# Patient Record
Sex: Male | Born: 1955 | Race: White | Hispanic: No | Marital: Married
Health system: Southern US, Community
[De-identification: ages and names within clinical notes are randomized; demographics above are authoritative.]

## PROBLEM LIST (undated history)

## (undated) DIAGNOSIS — I10 Essential (primary) hypertension: Secondary | ICD-10-CM

## (undated) DIAGNOSIS — K219 Gastro-esophageal reflux disease without esophagitis: Secondary | ICD-10-CM

## (undated) DIAGNOSIS — E785 Hyperlipidemia, unspecified: Secondary | ICD-10-CM

## (undated) DIAGNOSIS — K635 Polyp of colon: Secondary | ICD-10-CM

## (undated) HISTORY — DX: Essential (primary) hypertension: I10

## (undated) HISTORY — DX: Polyp of colon: K63.5

## (undated) HISTORY — DX: Gastro-esophageal reflux disease without esophagitis: K21.9

## (undated) HISTORY — PX: CATARACT EXTRACTION: SUR2

## (undated) HISTORY — DX: Hyperlipidemia, unspecified: E78.5

---

## 2007-06-19 LAB — HM COLONOSCOPY

## 2008-03-28 DIAGNOSIS — K635 Polyp of colon: Secondary | ICD-10-CM

## 2008-03-28 HISTORY — DX: Polyp of colon: K63.5

## 2013-01-31 ENCOUNTER — Encounter: Payer: Self-pay | Admitting: Gastroenterology

## 2013-03-13 ENCOUNTER — Ambulatory Visit: Payer: Self-pay | Admitting: Gastroenterology

## 2013-04-15 ENCOUNTER — Encounter: Payer: Self-pay | Admitting: Gastroenterology

## 2013-04-15 ENCOUNTER — Ambulatory Visit (INDEPENDENT_AMBULATORY_CARE_PROVIDER_SITE_OTHER): Payer: BC Managed Care – PPO | Admitting: Gastroenterology

## 2013-04-15 VITALS — BP 110/76 | HR 64 | Ht 64.5 in | Wt 226.1 lb

## 2013-04-15 DIAGNOSIS — Z1211 Encounter for screening for malignant neoplasm of colon: Secondary | ICD-10-CM

## 2013-04-15 DIAGNOSIS — K219 Gastro-esophageal reflux disease without esophagitis: Secondary | ICD-10-CM

## 2013-04-15 NOTE — Patient Instructions (Signed)
Follow up as needed

## 2013-04-15 NOTE — Assessment & Plan Note (Signed)
Prior colonoscopy apparently was normal 7 years ago.  Plan followup colonoscopy in 3 years

## 2013-04-15 NOTE — Assessment & Plan Note (Signed)
Symptoms are well controlled with Protonix.  Endoscopy 7 years ago by report was unremarkable.  Recommendations #1 continue Protonix.  He may try reducing to every other day.  In the absence of symptoms including ongoing pain or dysphagia I do not think repeat endoscopy is required.

## 2013-04-15 NOTE — Progress Notes (Signed)
    _                                                                                                                History of Present Illness: Pleasant 58 year old white male referred at the request of Dr. Kennon Holter for evaluation of reflux.  He has had reflux for at least 10 years.  Symptoms are well controlled with Protonix.  He apparently underwent colonoscopy and upper endoscopy about 7 years ago which were unremarkable.  If he misses doses of medication he may get pyrosis.  He denies dysphagia.  There is no history of rectal bleeding or change in bowel habits.    Past Medical History  Diagnosis Date  . Colon polyps 2010  . GERD (gastroesophageal reflux disease)   . HLD (hyperlipidemia)    History reviewed. No pertinent past surgical history. family history includes Brain cancer in his mother; Diabetes in his sister; Heart disease in his father. Current Outpatient Prescriptions  Medication Sig Dispense Refill  . pantoprazole (PROTONIX) 20 MG tablet Take 20 mg by mouth 2 (two) times daily.       No current facility-administered medications for this visit.   Allergies as of 04/15/2013 - Review Complete 04/15/2013  Allergen Reaction Noted  . Scallops [shellfish allergy]  04/15/2013    reports that he has never smoked. He has never used smokeless tobacco. He reports that he drinks alcohol. He reports that he does not use illicit drugs.     Review of Systems: Pertinent positive and negative review of systems were noted in the above HPI section. All other review of systems were otherwise negative.  Vital signs were reviewed in today's medical record Physical Exam: General: Well developed , well nourished, no acute distress Skin: anicteric Head: Normocephalic and atraumatic Eyes:  sclerae anicteric, EOMI Ears: Normal auditory acuity Mouth: No deformity or lesions Neck: Supple, no masses or thyromegaly Lungs: Clear throughout to auscultation Heart: Regular rate and  rhythm; no murmurs, rubs or bruits Abdomen: Soft, non tender and non distended. No masses, hepatosplenomegaly or hernias noted. Normal Bowel sounds Rectal:deferred Musculoskeletal: Symmetrical with no gross deformities  Skin: No lesions on visible extremities Pulses:  Normal pulses noted Extremities: No clubbing, cyanosis, edema or deformities noted Neurological: Alert oriented x 4, grossly nonfocal Cervical Nodes:  No significant cervical adenopathy Inguinal Nodes: No significant inguinal adenopathy Psychological:  Alert and cooperative. Normal mood and affect  See Assessment and Plan under Problem List

## 2014-04-04 ENCOUNTER — Telehealth: Payer: Self-pay

## 2014-04-04 ENCOUNTER — Ambulatory Visit (INDEPENDENT_AMBULATORY_CARE_PROVIDER_SITE_OTHER): Payer: BLUE CROSS/BLUE SHIELD | Admitting: Family Medicine

## 2014-04-04 VITALS — BP 124/88 | HR 62 | Temp 98.1°F | Resp 16 | Ht 66.0 in | Wt 226.0 lb

## 2014-04-04 DIAGNOSIS — M5432 Sciatica, left side: Secondary | ICD-10-CM

## 2014-04-04 MED ORDER — PREDNISONE 20 MG PO TABS: 20.0000 mg | ORAL_TABLET | Freq: Every morning | ORAL | Status: DC

## 2014-04-04 MED ORDER — HYDROCODONE-ACETAMINOPHEN 5-325 MG PO TABS: 1.0000 | ORAL_TABLET | Freq: Every evening | ORAL | Status: DC | PRN

## 2014-04-04 NOTE — Telephone Encounter (Signed)
Patient was seen today and given 2 medications. The "Predinsone" was called in to [facility] on [address] and per patient they will not fill it. Patient stated the pharmacist informed him the directions were written incorrectly. Patient is requesting our office to please contact [facility] to clarify directions and to call him to confirm it has been done at [phone]

## 2014-04-04 NOTE — Patient Instructions (Signed)
Sciatica Sciatica is pain, weakness, numbness, or tingling along the path of the sciatic nerve. The nerve starts in the lower back and runs down the back of each leg. The nerve controls the muscles in the lower leg and in the back of the knee, while also providing sensation to the back of the thigh, lower leg, and the sole of your foot. Sciatica is a symptom of another medical condition. For instance, nerve damage or certain conditions, such as a herniated disk or bone spur on the spine, pinch or put pressure on the sciatic nerve. This causes the pain, weakness, or other sensations normally associated with sciatica. Generally, sciatica only affects one side of the body. CAUSES   Herniated or slipped disc.  Degenerative disk disease.  A pain disorder involving the narrow muscle in the buttocks (piriformis syndrome).  Pelvic injury or fracture.  Pregnancy.  Tumor (rare). SYMPTOMS  Symptoms can vary from mild to very severe. The symptoms usually travel from the low back to the buttocks and down the back of the leg. Symptoms can include:  Mild tingling or dull aches in the lower back, leg, or hip.  Numbness in the back of the calf or sole of the foot.  Burning sensations in the lower back, leg, or hip.  Sharp pains in the lower back, leg, or hip.  Leg weakness.  Severe back pain inhibiting movement. These symptoms may get worse with coughing, sneezing, laughing, or prolonged sitting or standing. Also, being overweight may worsen symptoms. DIAGNOSIS  Your caregiver will perform a physical exam to look for common symptoms of sciatica. He or she may ask you to do certain movements or activities that would trigger sciatic nerve pain. Other tests may be performed to find the cause of the sciatica. These may include:  Blood tests.  X-rays.  Imaging tests, such as an MRI or CT scan. TREATMENT  Treatment is directed at the cause of the sciatic pain. Sometimes, treatment is not necessary  and the pain and discomfort goes away on its own. If treatment is needed, your caregiver may suggest:  Over-the-counter medicines to relieve pain.  Prescription medicines, such as anti-inflammatory medicine, muscle relaxants, or narcotics.  Applying heat or ice to the painful area.  Steroid injections to lessen pain, irritation, and inflammation around the nerve.  Reducing activity during periods of pain.  Exercising and stretching to strengthen your abdomen and improve flexibility of your spine. Your caregiver may suggest losing weight if the extra weight makes the back pain worse.  Physical therapy.  Surgery to eliminate what is pressing or pinching the nerve, such as a bone spur or part of a herniated disk. HOME CARE INSTRUCTIONS   Only take over-the-counter or prescription medicines for pain or discomfort as directed by your caregiver.  Apply ice to the affected area for 20 minutes, 3-4 times a day for the first 48-72 hours. Then try heat in the same way.  Exercise, stretch, or perform your usual activities if these do not aggravate your pain.  Attend physical therapy sessions as directed by your caregiver.  Keep all follow-up appointments as directed by your caregiver.  Do not wear high heels or shoes that do not provide proper support.  Check your mattress to see if it is too soft. A firm mattress may lessen your pain and discomfort. SEEK IMMEDIATE MEDICAL CARE IF:   You lose control of your bowel or bladder (incontinence).  You have increasing weakness in the lower back, pelvis, buttocks,   or legs.  You have redness or swelling of your back.  You have a burning sensation when you urinate.  You have pain that gets worse when you lie down or awakens you at night.  Your pain is worse than you have experienced in the past.  Your pain is lasting longer than 4 weeks.  You are suddenly losing weight without reason. MAKE SURE YOU:  Understand these  instructions.  Will watch your condition.  Will get help right away if you are not doing well or get worse. Document Released: 03/08/2001 Document Revised: 09/13/2011 Document Reviewed: 07/24/2011 ExitCare Patient Information 2015 [employer]. This information is not intended to replace advice given to you by your health care provider. Make sure you discuss any questions you have with your health care provider.  

## 2014-04-04 NOTE — Progress Notes (Signed)
   Subjective:  This chart was scribed for Robyn Haber, MD by Molli Posey, Medical scribe. This patient was seen in ROOM 4 and the patient's care was started 12:09 PM.   Patient ID: Drew [name], male    DOB: 1956/02/28, 59 y.o.   MRN: [id number]   Chief Complaint  Patient presents with  . Leg Pain    Left side, no relief with elevation, 8-9 on pain scale sometimes, started in foot about 6 months ago   HPI HPI Comments: Drew [name] is a 59 y.o. male with a history of GERD who presents to [facility] complaining of left leg pain that started about 6 months ago and has worsened in the last 2 weeks. He states that the pain started in the top part of his left foot 6 months ago but has since radiated up his left leg. Pt states that when he sits for extended periods of time his pain worsens and has been causing him problems with sleep. He states that sometimes when he rolls over when he is lying down he experiences "extreme pain" and says the muscle sometimes cramps. He states he has been taking ibuprofen, caffeine, acetaminophen medications for his pain. 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 He reports no pertinent past medical history. Pt reports NKDA.    Patient Active Problem List   Diagnosis Date Noted  . Esophageal reflux 04/15/2013  . Special screening for malignant neoplasms, colon 04/15/2013   Allergies  Allergen Reactions  . Scallops [Shellfish Allergy]    Current Outpatient Prescriptions on File Prior to Visit  Medication Sig Dispense Refill  . pantoprazole (PROTONIX) 20 MG tablet Take 20 mg by mouth 2 (two) times daily.     No current facility-administered medications on file prior to visit.    Family History  Problem Relation Age of Onset  . Diabetes Sister   . Heart disease Father   . Brain cancer Mother     chemical exposure   History reviewed. No pertinent past surgical history.   Review of Systems    Musculoskeletal: Positive for myalgias.      Objective:   Physical Exam  Constitutional: He is oriented to person, place, and time. He appears well-developed and well-nourished.  HENT:  Head: Normocephalic and atraumatic.  Eyes: Right eye exhibits no discharge. Left eye exhibits no discharge.  Neck: Neck supple. No tracheal deviation present.  Cardiovascular: Normal rate.   Pulmonary/Chest: Effort normal. No respiratory distress.  Abdominal: He exhibits no distension.  Neurological: He is alert and oriented to person, place, and time.  Skin: Skin is warm and dry.  Psychiatric: He has a normal mood and affect. His behavior is normal.  Nursing note and vitals reviewed.  Positive SLR on left, positive fem stretch on left Normal reflexes on lower extrem No edema Good distal pulses    Assessment & Plan:   This chart was scribed in my presence and reviewed by me personally.    ICD-9-CM ICD-10-CM   1. Sciatica, left 724.3 M54.32 predniSONE (DELTASONE) 20 MG tablet     HYDROcodone-acetaminophen (NORCO) 5-325 MG per tablet     Ambulatory referral to Physical Therapy     Signed, Robyn Haber, MD

## 2014-04-04 NOTE — Telephone Encounter (Signed)
Confirmed this with the pharmacy- 2 daily for 5 days/1 daily for 5 days. Pt notified.

## 2014-06-13 ENCOUNTER — Other Ambulatory Visit: Payer: Self-pay | Admitting: Family Medicine

## 2014-06-13 ENCOUNTER — Ambulatory Visit (INDEPENDENT_AMBULATORY_CARE_PROVIDER_SITE_OTHER): Payer: BC Managed Care – PPO | Admitting: Family Medicine

## 2014-06-13 ENCOUNTER — Ambulatory Visit
Admission: RE | Admit: 2014-06-13 | Discharge: 2014-06-13 | Disposition: A | Discharge status: Home / Self Care | Payer: BC Managed Care – PPO | Source: Ambulatory Visit | Attending: Family Medicine | Admitting: Family Medicine

## 2014-06-13 ENCOUNTER — Other Ambulatory Visit: Payer: BC Managed Care – PPO

## 2014-06-13 ENCOUNTER — Ambulatory Visit: Payer: BC Managed Care – PPO

## 2014-06-13 VITALS — BP 122/78 | HR 61 | Temp 98.2°F | Resp 16 | Ht 64.5 in | Wt 223.4 lb

## 2014-06-13 DIAGNOSIS — M5431 Sciatica, right side: Secondary | ICD-10-CM | POA: Diagnosis not present

## 2014-06-13 DIAGNOSIS — Z1389 Encounter for screening for other disorder: Secondary | ICD-10-CM | POA: Diagnosis not present

## 2014-06-13 DIAGNOSIS — M545 Low back pain, unspecified: Secondary | ICD-10-CM

## 2014-06-13 MED ORDER — MELOXICAM 7.5 MG PO TABS: 7.5000 mg | ORAL_TABLET | Freq: Every day | ORAL | Status: DC

## 2014-06-13 MED ORDER — OXYCODONE-ACETAMINOPHEN 7.5-325 MG PO TABS: 1.0000 | ORAL_TABLET | ORAL | Status: DC | PRN

## 2014-06-13 MED ORDER — CYCLOBENZAPRINE HCL 5 MG PO TABS: 5.0000 mg | ORAL_TABLET | Freq: Three times a day (TID) | ORAL | Status: DC | PRN

## 2014-06-13 NOTE — Progress Notes (Addendum)
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 He's been having back pain since before January. At that time, he also had a problem with his right foot.  Patient was sent for physical therapy in January and has done fairly well with this right foot problem but his back pain is persisted. One week ago he fell down while holding a ladder as he backed up. He landed on his "tailbone" and has had severe pain ever since. He's been unable to sleep more than 1-2 hours every night and has resorted to taking some hydrocodone that was prescribed back in January.  He's had no problems with elimination, no weakness in his legs. He does have trouble standing erect, preferring to stand with slightly stooped posture to protect his back.  Objective: Patient has tenderness over the right posterior superior iliac crest. He is able to stand on one leg at a time. No muscle wasting [facility] reading (PRIMARY) by  Dr. Joseph Art:  No metallic f.b. On skull films   Assessment: Patient clearly has acute back which needs further evaluation as well as pain control. He did not get much benefit from the prednisone in January, so will hold on that.  This chart was scribed in my presence and reviewed by me personally.    ICD-9-CM ICD-10-CM   1. Sciatica, right 724.3 M54.31 MR Lumbar Spine Wo Contrast     oxyCODONE-acetaminophen (PERCOCET) 7.5-325 MG per tablet     cyclobenzaprine (FLEXERIL) 5 MG tablet     meloxicam (MOBIC) 7.5 MG tablet  2. Acute low back pain 724.2 M54.5 oxyCODONE-acetaminophen (PERCOCET) 7.5-325 MG per tablet     cyclobenzaprine (FLEXERIL) 5 MG tablet     meloxicam (MOBIC) 7.5 MG tablet  3. Encounter for imaging to screen for metal prior to MRI V82.89 Z13.89 DG Eye Foreign Body     Signed, Robyn Haber, MD

## 2014-06-13 NOTE — Patient Instructions (Signed)
Go to [facility] for MRI Lumbar spine (without contrast) 06/13/14 at 8:10pm [place] # [id number]

## 2014-06-15 ENCOUNTER — Other Ambulatory Visit: Payer: Self-pay | Admitting: Family Medicine

## 2014-06-15 DIAGNOSIS — M519 Unspecified thoracic, thoracolumbar and lumbosacral intervertebral disc disorder: Secondary | ICD-10-CM

## 2014-06-19 ENCOUNTER — Other Ambulatory Visit: Payer: BC Managed Care – PPO

## 2014-07-22 ENCOUNTER — Ambulatory Visit (INDEPENDENT_AMBULATORY_CARE_PROVIDER_SITE_OTHER): Payer: BC Managed Care – PPO | Admitting: Emergency Medicine

## 2014-07-22 VITALS — BP 120/76 | HR 60 | Temp 98.0°F | Resp 16 | Ht 64.5 in | Wt 218.2 lb

## 2014-07-22 DIAGNOSIS — M5432 Sciatica, left side: Secondary | ICD-10-CM

## 2014-07-22 DIAGNOSIS — M5430 Sciatica, unspecified side: Secondary | ICD-10-CM | POA: Insufficient documentation

## 2014-07-22 MED ORDER — NAPROXEN SODIUM 550 MG PO TABS: 550.0000 mg | ORAL_TABLET | Freq: Two times a day (BID) | ORAL | Status: DC

## 2014-07-22 NOTE — Patient Instructions (Signed)
Sciatica Sciatica is pain, weakness, numbness, or tingling along the path of the sciatic nerve. The nerve starts in the lower back and runs down the back of each leg. The nerve controls the muscles in the lower leg and in the back of the knee, while also providing sensation to the back of the thigh, lower leg, and the sole of your foot. Sciatica is a symptom of another medical condition. For instance, nerve damage or certain conditions, such as a herniated disk or bone spur on the spine, pinch or put pressure on the sciatic nerve. This causes the pain, weakness, or other sensations normally associated with sciatica. Generally, sciatica only affects one side of the body. CAUSES   Herniated or slipped disc.  Degenerative disk disease.  A pain disorder involving the narrow muscle in the buttocks (piriformis syndrome).  Pelvic injury or fracture.  Pregnancy.  Tumor (rare). SYMPTOMS  Symptoms can vary from mild to very severe. The symptoms usually travel from the low back to the buttocks and down the back of the leg. Symptoms can include:  Mild tingling or dull aches in the lower back, leg, or hip.  Numbness in the back of the calf or sole of the foot.  Burning sensations in the lower back, leg, or hip.  Sharp pains in the lower back, leg, or hip.  Leg weakness.  Severe back pain inhibiting movement. These symptoms may get worse with coughing, sneezing, laughing, or prolonged sitting or standing. Also, being overweight may worsen symptoms. DIAGNOSIS  Your caregiver will perform a physical exam to look for common symptoms of sciatica. He or she may ask you to do certain movements or activities that would trigger sciatic nerve pain. Other tests may be performed to find the cause of the sciatica. These may include:  Blood tests.  X-rays.  Imaging tests, such as an MRI or CT scan. TREATMENT  Treatment is directed at the cause of the sciatic pain. Sometimes, treatment is not necessary  and the pain and discomfort goes away on its own. If treatment is needed, your caregiver may suggest:  Over-the-counter medicines to relieve pain.  Prescription medicines, such as anti-inflammatory medicine, muscle relaxants, or narcotics.  Applying heat or ice to the painful area.  Steroid injections to lessen pain, irritation, and inflammation around the nerve.  Reducing activity during periods of pain.  Exercising and stretching to strengthen your abdomen and improve flexibility of your spine. Your caregiver may suggest losing weight if the extra weight makes the back pain worse.  Physical therapy.  Surgery to eliminate what is pressing or pinching the nerve, such as a bone spur or part of a herniated disk. HOME CARE INSTRUCTIONS   Only take over-the-counter or prescription medicines for pain or discomfort as directed by your caregiver.  Apply ice to the affected area for 20 minutes, 3-4 times a day for the first 48-72 hours. Then try heat in the same way.  Exercise, stretch, or perform your usual activities if these do not aggravate your pain.  Attend physical therapy sessions as directed by your caregiver.  Keep all follow-up appointments as directed by your caregiver.  Do not wear high heels or shoes that do not provide proper support.  Check your mattress to see if it is too soft. A firm mattress may lessen your pain and discomfort. SEEK IMMEDIATE MEDICAL CARE IF:   You lose control of your bowel or bladder (incontinence).  You have increasing weakness in the lower back, pelvis, buttocks,   or legs.  You have redness or swelling of your back.  You have a burning sensation when you urinate.  You have pain that gets worse when you lie down or awakens you at night.  Your pain is worse than you have experienced in the past.  Your pain is lasting longer than 4 weeks.  You are suddenly losing weight without reason. MAKE SURE YOU:  Understand these  instructions.  Will watch your condition.  Will get help right away if you are not doing well or get worse. Document Released: 03/08/2001 Document Revised: 09/13/2011 Document Reviewed: 07/24/2011 ExitCare Patient Information 2015 [employer]. This information is not intended to replace advice given to you by your health care provider. Make sure you discuss any questions you have with your health care provider.  

## 2014-07-22 NOTE — Progress Notes (Signed)
[facility] [address] [place] [address] [phone]  Date:  07/22/2014   Name:  Kaidence Callaway   DOB:  03-21-1956   MRN:  [id number]  PCP:  Elizabeth Palau, MD    Chief Complaint: Medication Refill and Follow-up   History of Present Illness:  Benjamen Koelling is a 59 y.o. very pleasant male patient who presents with the following:  History of sciatic neuritis with left leg.  Has positive MRI  Has been relying on percocet and oxycontin and wants to avoid medication if possible Works as professor at [employer] Has not had surgical opinion No weakness or tingling. No improvement with over the counter medications or other home remedies. . Denies other complaint or health concern today.   Patient Active Problem List   Diagnosis Date Noted  . 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 Esophageal reflux 04/15/2013  . Special screening for malignant neoplasms, colon 04/15/2013    Past Medical History  Diagnosis Date  . Colon polyps 2010  . GERD (gastroesophageal reflux disease)   . HLD (hyperlipidemia)     History reviewed. No pertinent past surgical history.  History  Substance Use Topics  . Smoking status: Never Smoker   . Smokeless tobacco: Never Used  . Alcohol Use: 0.0 oz/week    0 Standard drinks or equivalent per week     Comment: 1-2 per day    Family History  Problem Relation Age of Onset  . Diabetes Sister   . Heart disease Father   . Brain cancer Mother     chemical exposure    Allergies  Allergen Reactions  . Scallops [Shellfish Allergy]     Medication list has been reviewed and updated.  Current Outpatient Prescriptions on File Prior to Visit  Medication Sig Dispense Refill  . cyclobenzaprine (FLEXERIL) 5 MG tablet Take 1 tablet (5 mg total) by mouth 3 (three) times daily as needed for muscle spasms. 30 tablet 0  . HYDROcodone-acetaminophen (NORCO) 5-325 MG per tablet Take 1 tablet by mouth at bedtime as needed for moderate pain. 12  tablet 0  . oxyCODONE-acetaminophen (PERCOCET) 7.5-325 MG per tablet Take 1 tablet by mouth every 4 (four) hours as needed for pain. 30 tablet 0  . pantoprazole (PROTONIX) 20 MG tablet Take 20 mg by mouth 2 (two) times daily.    . meloxicam (MOBIC) 7.5 MG tablet Take 1 tablet (7.5 mg total) by mouth daily. (Patient not taking: Reported on 07/22/2014) 30 tablet 0  . predniSONE (DELTASONE) 20 MG tablet Take 1 tablet (20 mg total) by mouth every morning. 2 daily for 5 days, then one daily with food (Patient not taking: Reported on 06/13/2014) 15 tablet 0   No current facility-administered medications on file prior to visit.    Review of Systems:   GEN: WDWN, NAD, Non-toxic, Alert & Oriented x 3 HEENT: Atraumatic, Normocephalic.  Ears and Nose: No external deformity. EXTR: No clubbing/cyanosis/edema NEURO: Normal gait.  PSYCH: Normally interactive. Conversant. Not depressed or anxious appearing.  Calm demeanor.    Physical Examination: Filed Vitals:   07/22/14 1249  BP: 120/76  Pulse: 60  Temp: 98 F (36.7 C)  Resp: 16   Filed Vitals:   07/22/14 1249  Height: 5' 4.5" (1.638 m)  Weight: 218 lb 4 oz (98.998 kg)   Body mass index is 36.9 kg/(m^2). Ideal Body Weight: Weight in (lb) to have BMI = 25: 147.6   GEN: WDWN, NAD, Non-toxic, Alert & Oriented x 3  HEENT: Atraumatic, Normocephalic.  Ears and Nose: No external deformity. EXTR: No clubbing/cyanosis/edema NEURO: Normal gait.   Decreased patellar reflex on left.  Motor intact PSYCH: Normally interactive. Conversant. Not depressed or anxious appearing.  Calm demeanor.    Assessment and Plan: Sciatic neuritis [facility] Neurosurgery consultation  Signed,  Ellison Carwin, MD

## 2014-08-06 ENCOUNTER — Telehealth: Payer: Self-pay

## 2014-08-06 NOTE — Telephone Encounter (Signed)
Called [facility] to verify that they haven't tried to call him. He has not been scheduled yet. They said they will work on it, but it definitely won't be this week. Can we give him more pain meds until he is seen by them?

## 2014-08-06 NOTE — Telephone Encounter (Signed)
Pt says he has not heard from [facility] yet, he says he has not heard anything. He wants a refill of percocet to last until the appt. Referrals will call and check status

## 2014-08-07 NOTE — Telephone Encounter (Signed)
i'll be there at 2 to sign the prescription

## 2015-04-20 ENCOUNTER — Ambulatory Visit (INDEPENDENT_AMBULATORY_CARE_PROVIDER_SITE_OTHER): Payer: BC Managed Care – PPO | Admitting: Physician Assistant

## 2015-04-20 VITALS — BP 124/80 | HR 93 | Temp 98.4°F | Resp 17 | Ht 64.5 in | Wt 229.0 lb

## 2015-04-20 DIAGNOSIS — Z760 Encounter for issue of repeat prescription: Secondary | ICD-10-CM | POA: Diagnosis not present

## 2015-04-20 DIAGNOSIS — Z139 Encounter for screening, unspecified: Secondary | ICD-10-CM

## 2015-04-20 DIAGNOSIS — Z23 Encounter for immunization: Secondary | ICD-10-CM

## 2015-04-20 DIAGNOSIS — M47819 Spondylosis without myelopathy or radiculopathy, site unspecified: Secondary | ICD-10-CM | POA: Diagnosis not present

## 2015-04-20 LAB — POCT CBG (FASTING - GLUCOSE)-MANUAL ENTRY: Glucose Fasting, POC: 77 mg/dL (ref 70–99)

## 2015-04-20 LAB — POCT CBC
Granulocyte percent: 62.3 %G (ref 37–80)
HEMATOCRIT: 44.5 % (ref 43.5–53.7)
Hemoglobin: 15.4 g/dL (ref 14.1–18.1)
LYMPH, POC: 2.6 (ref 0.6–3.4)
MCH, POC: 30.6 pg (ref 27–31.2)
MCHC: 34.6 g/dL (ref 31.8–35.4)
MCV: 88.4 fL (ref 80–97)
MID (CBC): 0.4 (ref 0–0.9)
MPV: 7 fL (ref 0–99.8)
POC GRANULOCYTE: 4.9 (ref 2–6.9)
POC LYMPH PERCENT: 33.2 %L (ref 10–50)
POC MID %: 4.5 % (ref 0–12)
Platelet Count, POC: 199 10*3/uL (ref 142–424)
RBC: 5.03 M/uL (ref 4.69–6.13)
RDW, POC: 12.9 %
WBC: 7.9 10*3/uL (ref 4.6–10.2)

## 2015-04-20 LAB — POCT GLYCOSYLATED HEMOGLOBIN (HGB A1C): Hemoglobin A1C: 5.8

## 2015-04-20 MED ORDER — NAPROXEN 500 MG PO TABS: 500.0000 mg | ORAL_TABLET | Freq: Two times a day (BID) | ORAL | Status: DC

## 2015-04-20 MED ORDER — PANTOPRAZOLE SODIUM 20 MG PO TBEC: 20.0000 mg | DELAYED_RELEASE_TABLET | Freq: Two times a day (BID) | ORAL | Status: DC

## 2015-04-20 MED ORDER — PREDNISONE 20 MG PO TABS: ORAL_TABLET | ORAL | Status: AC

## 2015-04-20 MED ORDER — HYDROCODONE-ACETAMINOPHEN 5-325 MG PO TABS: 1.0000 | ORAL_TABLET | Freq: Four times a day (QID) | ORAL | Status: DC | PRN

## 2015-04-20 NOTE — Patient Instructions (Signed)

## 2015-04-20 NOTE — Progress Notes (Signed)
04/20/2015 5:38 PM   DOB: September 05, 1955 / MRN: [id number]  SUBJECTIVE:  Drew [name] is a 60 y.o. male presenting for back pain, to establish care, and for and pantoprazole 20 mg.  He has had a colonoscopy roughly 8 years ago in [place] and he was advised to return in 10 years.  He has also had an upper endoscopy at that time and this was normal.  He has been taking pantoprazole since for GERD with excellent relief.Marland Kitchen  He only has a problems if he eats tomato based problems. He is a never smoker. He  has no history of HTN.    Reports he is having a flare of his back pain right now.  He has an MRI in [facility] showing spondylosis.  Denies any weakness, radicular pain, history of diabetes, incontinence.      He is allergic to scallops.   He  has a past medical history of Colon polyps (2010); GERD (gastroesophageal reflux disease); and HLD (hyperlipidemia).    He  reports that he has never smoked. He has never used smokeless tobacco. He reports that he drinks alcohol. He reports that he does not use illicit drugs. He  has no sexual activity history on file. The patient  has no past surgical history on file.  His family history includes Brain cancer in his mother; Diabetes in his sister; Heart disease in his father.  Review of Systems  Constitutional: Negative for fever and chills.  Eyes: Negative for blurred vision.  Respiratory: Negative for cough and shortness of breath.   Cardiovascular: Negative for chest pain.  Gastrointestinal: Negative for nausea and abdominal pain.  Genitourinary: Negative for dysuria, urgency and frequency.  Musculoskeletal: Negative for myalgias.  Skin: Negative for rash.  Neurological: Negative for dizziness, tingling and headaches.  Psychiatric/Behavioral: Negative for depression. The patient is not nervous/anxious.     Problem list and medications reviewed and updated by myself where necessary, and exist elsewhere in the encounter.   OBJECTIVE:  BP 124/80 mmHg   Pulse 93  Temp(Src) 98.4 F (36.9 C) (Oral)  Resp 17  Ht 5' 4.5" (1.638 m)  Wt 229 lb (103.874 kg)  BMI 38.71 kg/m2  SpO2 98%  Physical Exam  Constitutional: He is oriented to person, place, and time. He appears well-developed. He does not appear ill.  Eyes: Conjunctivae and EOM are normal. Pupils are equal, round, and reactive to light.  Cardiovascular: Normal rate.   Pulmonary/Chest: Effort normal.  Abdominal: He exhibits no distension.  Musculoskeletal: Normal range of motion.  Neurological: He is alert and oriented to person, place, and time. He has normal strength. No cranial nerve deficit or sensory deficit. Coordination and gait normal.  Reflex Scores:      Patellar reflexes are 2+ on the right side and 2+ on the left side.      Achilles reflexes are 2+ on the right side and 2+ on the left side. Skin: Skin is warm and dry. He is not diaphoretic.  Psychiatric: He has a normal mood and affect.  Nursing note and vitals reviewed.   Results for orders placed or performed in visit on 04/20/15 (from the past 72 hour(s))  POCT CBG (Fasting - Glucose)     Status: None   Collection Time: 04/20/15  4:16 PM  Result Value Ref Range   Glucose Fasting, POC 77 70 - 99 mg/dL  POCT glycosylated hemoglobin (Hb A1C)     Status: None   Collection Time: 04/20/15  4:16 PM  Result Value Ref Range   Hemoglobin A1C 5.8   POCT CBC     Status: None   Collection Time: 04/20/15  4:16 PM  Result Value Ref Range   WBC 7.9 4.6 - 10.2 K/uL   Lymph, poc 2.6 0.6 - 3.4   POC LYMPH PERCENT 33.2 10 - 50 %L   MID (cbc) 0.4 0 - 0.9   POC MID % 4.5 0 - 12 %M   POC Granulocyte 4.9 2 - 6.9   Granulocyte percent 62.3 37 - 80 %G   RBC 5.03 4.69 - 6.13 M/uL   Hemoglobin 15.4 14.1 - 18.1 g/dL   HCT, POC 44.5 43.5 - 53.7 %   MCV 88.4 80 - 97 fL   MCH, POC 30.6 27 - 31.2 pg   MCHC 34.6 31.8 - 35.4 g/dL   RDW, POC 12.9 %   Platelet Count, POC 199 142 - 424 K/uL   MPV 7.0 0 - 99.8 fL    ASSESSMENT AND  PLAN  Drew [name] was seen today for back pain.  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  He will try to get his endoscopy records for scanning purposes.  History of back pain, S/P corticosteroid injections with poor relief.  He has had an MRI which should some anterolithstesis.  He will bring his ortho records for scanning.  Will try to get him back to that provider for further management.  Advised that he continue being physically active, start prednisone today, and after start Naprosyn as needed.  Norco for severe pain only.   -     naproxen (NAPROSYN) 500 MG tablet; Take 1 tablet (500 mg total) by mouth 2 (two) times daily with a meal. Do not take with prednisone. -     predniSONE (DELTASONE) 20 MG tablet; Take 3 in the morning for 3 days, then 2 in the morning for 3 days, and then 1 in the morning for 3 days. -     HYDROcodone-acetaminophen (NORCO) 5-325 MG tablet; Take 1 tablet by mouth every 6 (six) hours as needed (For severe pain only.). For severe pain only.  Screening -     COMPLETE METABOLIC PANEL WITH GFR -     POCT CBG (Fasting - Glucose) -     POCT glycosylated hemoglobin (Hb A1C) -     Lipid panel -     POCT CBC  Medication refill -     pantoprazole (PROTONIX) 20 MG tablet; Take 1 tablet (20 mg total) by mouth 2 (two) times daily.  Other orders -     Discontinue: HYDROcodone-acetaminophen (NORCO) 5-325 MG tablet; Take 1 tablet by mouth every 6 (six) hours as needed for severe pain. For severe pain only.    The patient was advised to call or return to clinic if he does not see an improvement in symptoms or to seek the care of the closest emergency department if he worsens with the above plan.   Philis Fendt, MHS, PA-C [facility] 04/20/2015 5:38 PM

## 2015-04-21 LAB — COMPLETE METABOLIC PANEL WITH GFR
ALK PHOS: 69 U/L (ref 40–115)
ALT: 26 U/L (ref 9–46)
AST: 22 U/L (ref 10–35)
Albumin: 4.3 g/dL (ref 3.6–5.1)
BILIRUBIN TOTAL: 0.6 mg/dL (ref 0.2–1.2)
BUN: 20 mg/dL (ref 7–25)
CALCIUM: 9.7 mg/dL (ref 8.6–10.3)
CO2: 25 mmol/L (ref 20–31)
Chloride: 101 mmol/L (ref 98–110)
Creat: 1.26 mg/dL — ABNORMAL HIGH (ref 0.70–1.25)
GFR, EST AFRICAN AMERICAN: 71 mL/min (ref >=60)
GFR, Est Non African American: 61 mL/min (ref >=60)
Glucose, Bld: 93 mg/dL (ref 65–99)
Potassium: 4.2 mmol/L (ref 3.5–5.3)
Sodium: 140 mmol/L (ref 135–146)
TOTAL PROTEIN: 7.1 g/dL (ref 6.1–8.1)

## 2015-04-21 LAB — LIPID PANEL
CHOLESTEROL: 264 mg/dL — AB (ref 125–200)
HDL: 35 mg/dL — ABNORMAL LOW (ref >=40)
LDL Cholesterol: 159 mg/dL — ABNORMAL HIGH (ref <130)
TRIGLYCERIDES: 352 mg/dL — AB (ref <150)
Total CHOL/HDL Ratio: 7.5 Ratio — ABNORMAL HIGH (ref <=5.0)
VLDL: 70 mg/dL — ABNORMAL HIGH (ref <30)

## 2015-05-03 ENCOUNTER — Other Ambulatory Visit: Payer: Self-pay | Admitting: Physician Assistant

## 2015-05-03 DIAGNOSIS — Z299 Encounter for prophylactic measures, unspecified: Secondary | ICD-10-CM

## 2015-05-03 MED ORDER — ROSUVASTATIN CALCIUM 10 MG PO TABS: 10.0000 mg | ORAL_TABLET | Freq: Every day | ORAL | Status: DC

## 2015-05-03 MED ORDER — ASPIRIN 81 MG PO CHEW: 81.0000 mg | CHEWABLE_TABLET | Freq: Every day | ORAL | Status: DC

## 2015-05-14 ENCOUNTER — Ambulatory Visit (INDEPENDENT_AMBULATORY_CARE_PROVIDER_SITE_OTHER): Payer: BC Managed Care – PPO | Admitting: Physician Assistant

## 2015-05-14 VITALS — BP 108/78 | HR 67 | Temp 98.2°F | Resp 18 | Ht 64.5 in | Wt 227.2 lb

## 2015-05-14 DIAGNOSIS — R062 Wheezing: Secondary | ICD-10-CM | POA: Diagnosis not present

## 2015-05-14 DIAGNOSIS — R05 Cough: Secondary | ICD-10-CM

## 2015-05-14 DIAGNOSIS — R059 Cough, unspecified: Secondary | ICD-10-CM

## 2015-05-14 LAB — POCT CBC
Granulocyte percent: 60.6 %G (ref 37–80)
HCT, POC: 44.2 % (ref 43.5–53.7)
HEMOGLOBIN: 14.9 g/dL (ref 14.1–18.1)
LYMPH, POC: 1.7 (ref 0.6–3.4)
MCH, POC: 30.3 pg (ref 27–31.2)
MCHC: 33.7 g/dL (ref 31.8–35.4)
MCV: 89.9 fL (ref 80–97)
MID (cbc): 0.2 (ref 0–0.9)
MPV: 7.3 fL (ref 0–99.8)
POC Granulocyte: 3 (ref 2–6.9)
POC LYMPH PERCENT: 35.2 %L (ref 10–50)
POC MID %: 4.2 % (ref 0–12)
Platelet Count, POC: 160 10*3/uL (ref 142–424)
RBC: 4.92 M/uL (ref 4.69–6.13)
RDW, POC: 13 %
WBC: 4.9 10*3/uL (ref 4.6–10.2)

## 2015-05-14 MED ORDER — IPRATROPIUM BROMIDE 0.02 % IN SOLN: 0.5000 mg | Freq: Once | RESPIRATORY_TRACT | Status: DC

## 2015-05-14 MED ORDER — ALBUTEROL SULFATE (2.5 MG/3ML) 0.083% IN NEBU: 2.5000 mg | INHALATION_SOLUTION | Freq: Once | RESPIRATORY_TRACT | Status: DC

## 2015-05-14 NOTE — Progress Notes (Signed)
05/14/2015 2:01 PM   DOB: 1956/01/30 / MRN: [id number]  SUBJECTIVE:  Drew [name] is a 60 y.o. male presenting for cough that started 5 days ago.  Also complains of nasal congestion, mild sore throat, and some myalgia.  He has been taking Naprosyn 500 mg with good relief of symptoms.    He is seen by Dr. Brien Few for back injections.   He is allergic to scallops.   He  has a past medical history of Colon polyps (2010); GERD (gastroesophageal reflux disease); and HLD (hyperlipidemia).    He  reports that he has never smoked. He has never used smokeless tobacco. He reports that he drinks alcohol. He reports that he does not use illicit drugs. He  has no sexual activity history on file. The patient  has no past surgical history on file.  His family history includes Brain cancer in his mother; Diabetes in his sister; Heart disease in his father.  Review of Systems  Respiratory: Positive for cough. Negative for shortness of breath.   Cardiovascular: Negative for chest pain.  Gastrointestinal: Negative for abdominal pain.  Musculoskeletal: Negative for myalgias.  Skin: Negative for rash.  Neurological: Negative for dizziness and headaches.    Problem list and medications reviewed and updated by myself where necessary, and exist elsewhere in the encounter.   OBJECTIVE:  BP 108/78 mmHg  Pulse 67  Temp(Src) 98.2 F (36.8 C) (Oral)  Resp 18  Ht 5' 4.5" (1.638 m)  Wt 227 lb 3.2 oz (103.057 kg)  BMI 38.41 kg/m2  SpO2 97%  Physical Exam  Constitutional: He is oriented to person, place, and time. He appears well-developed. He does not appear ill.  Eyes: Conjunctivae and EOM are normal. Pupils are equal, round, and reactive to light.  Cardiovascular: Normal rate.   Pulmonary/Chest: Effort normal.  Abdominal: He exhibits no distension.  Musculoskeletal: Normal range of motion.  Neurological: He is alert and oriented to person, place, and time. No cranial nerve deficit. Coordination  normal.  Skin: Skin is warm and dry. He is not diaphoretic.  Psychiatric: He has a normal mood and affect.  Nursing note and vitals reviewed.   Results for orders placed or performed in visit on 05/14/15 (from the past 72 hour(s))  POCT CBC     Status: Normal   Collection Time: 05/14/15  1:45 PM  Result Value Ref Range   WBC 4.9 4.6 - 10.2 K/uL   Lymph, poc 1.7 0.6 - 3.4   POC LYMPH PERCENT 35.2 10 - 50 %L   MID (cbc) 0.2 0 - 0.9   POC MID % 4.2 0 - 12 %M   POC Granulocyte 3.0 2 - 6.9   Granulocyte percent 60.6 37 - 80 %G   RBC 4.92 4.69 - 6.13 M/uL   Hemoglobin 14.9 14.1 - 18.1 g/dL   HCT, POC 44.2 43.5 - 53.7 %   MCV 89.9 80 - 97 fL   MCH, POC 30.3 27 - 31.2 pg   MCHC 33.7 31.8 - 35.4 g/dL   RDW, POC 13.0 %   Platelet Count, POC 160 142 - 424 K/uL   MPV 7.3 0 - 99.8 fL    No results found.  ASSESSMENT AND PLAN  Drew [name] was seen today for sore throat, cough and headache.  Diagnoses and all orders for this visit:  Cough: He is wheezing throughout. Pulse and sats normal.  Advised prednisone taper for viral induced bronchospasm.    -     POCT  CBC  Wheezing -     POCT CBC -     albuterol (PROVENTIL) (2.5 MG/3ML) 0.083% nebulizer solution 2.5 mg; Take 3 mLs (2.5 mg total) by nebulization once. -     ipratropium (ATROVENT) nebulizer solution 0.5 mg; Take 2.5 mLs (0.5 mg total) by nebulization once.    The patient was advised to call or return to clinic if he does not see an improvement in symptoms or to seek the care of the closest emergency department if he worsens with the above plan.   Philis Fendt, MHS, PA-C [facility] 05/14/2015 2:01 PM

## 2015-05-14 NOTE — Patient Instructions (Signed)
Take your prednisone as instructed on the bottle and stop your naproxen for now.

## 2015-06-09 ENCOUNTER — Encounter: Payer: Self-pay | Admitting: Family Medicine

## 2015-06-26 ENCOUNTER — Other Ambulatory Visit: Payer: Self-pay

## 2015-06-26 DIAGNOSIS — Z760 Encounter for issue of repeat prescription: Secondary | ICD-10-CM

## 2015-06-26 MED ORDER — PANTOPRAZOLE SODIUM 20 MG PO TBEC: 20.0000 mg | DELAYED_RELEASE_TABLET | Freq: Two times a day (BID) | ORAL | Status: DC

## 2015-08-07 ENCOUNTER — Other Ambulatory Visit: Payer: Self-pay

## 2015-08-07 DIAGNOSIS — Z299 Encounter for prophylactic measures, unspecified: Secondary | ICD-10-CM

## 2015-08-07 MED ORDER — ROSUVASTATIN CALCIUM 10 MG PO TABS: 10.0000 mg | ORAL_TABLET | Freq: Every day | ORAL | Status: DC

## 2015-08-31 ENCOUNTER — Emergency Department (HOSPITAL_COMMUNITY)
Admission: EM | Admit: 2015-08-31 | Discharge: 2015-08-31 | Disposition: A | Discharge status: Home / Self Care | Payer: BC Managed Care – PPO | Attending: Emergency Medicine | Admitting: Emergency Medicine

## 2015-08-31 ENCOUNTER — Emergency Department (HOSPITAL_COMMUNITY): Payer: BC Managed Care – PPO

## 2015-08-31 DIAGNOSIS — E785 Hyperlipidemia, unspecified: Secondary | ICD-10-CM | POA: Diagnosis not present

## 2015-08-31 DIAGNOSIS — Z7982 Long term (current) use of aspirin: Secondary | ICD-10-CM | POA: Diagnosis not present

## 2015-08-31 DIAGNOSIS — Z79899 Other long term (current) drug therapy: Secondary | ICD-10-CM | POA: Diagnosis not present

## 2015-08-31 DIAGNOSIS — R112 Nausea with vomiting, unspecified: Secondary | ICD-10-CM | POA: Insufficient documentation

## 2015-08-31 DIAGNOSIS — R42 Dizziness and giddiness: Secondary | ICD-10-CM | POA: Diagnosis not present

## 2015-08-31 DIAGNOSIS — Z791 Long term (current) use of non-steroidal anti-inflammatories (NSAID): Secondary | ICD-10-CM | POA: Insufficient documentation

## 2015-08-31 DIAGNOSIS — Z86010 Personal history of colonic polyps: Secondary | ICD-10-CM | POA: Insufficient documentation

## 2015-08-31 DIAGNOSIS — K219 Gastro-esophageal reflux disease without esophagitis: Secondary | ICD-10-CM | POA: Insufficient documentation

## 2015-08-31 LAB — URINALYSIS, ROUTINE W REFLEX MICROSCOPIC
Bilirubin Urine: NEGATIVE
Glucose, UA: NEGATIVE mg/dL
Hgb urine dipstick: NEGATIVE
Ketones, ur: 15 mg/dL — AB
LEUKOCYTES UA: NEGATIVE
NITRITE: NEGATIVE
PH: 7.5 (ref 5.0–8.0)
Protein, ur: NEGATIVE mg/dL
SPECIFIC GRAVITY, URINE: 1.021 (ref 1.005–1.030)

## 2015-08-31 LAB — COMPREHENSIVE METABOLIC PANEL
ALBUMIN: 4.1 g/dL (ref 3.5–5.0)
ALK PHOS: 71 U/L (ref 38–126)
ALT: 33 U/L (ref 17–63)
ANION GAP: 10 (ref 5–15)
AST: 28 U/L (ref 15–41)
BILIRUBIN TOTAL: 0.8 mg/dL (ref 0.3–1.2)
BUN: 15 mg/dL (ref 6–20)
CALCIUM: 9.1 mg/dL (ref 8.9–10.3)
CO2: 24 mmol/L (ref 22–32)
Chloride: 104 mmol/L (ref 101–111)
Creatinine, Ser: 1.07 mg/dL (ref 0.61–1.24)
GFR calc non Af Amer: >60 mL/min (ref >60)
Glucose, Bld: 155 mg/dL — ABNORMAL HIGH (ref 65–99)
POTASSIUM: 3.6 mmol/L (ref 3.5–5.1)
Sodium: 138 mmol/L (ref 135–145)
TOTAL PROTEIN: 7.3 g/dL (ref 6.5–8.1)

## 2015-08-31 LAB — CBC
HEMATOCRIT: 42.3 % (ref 39.0–52.0)
HEMOGLOBIN: 14 g/dL (ref 13.0–17.0)
MCH: 29.4 pg (ref 26.0–34.0)
MCHC: 33.1 g/dL (ref 30.0–36.0)
MCV: 88.9 fL (ref 78.0–100.0)
Platelets: 167 10*3/uL (ref 150–400)
RBC: 4.76 MIL/uL (ref 4.22–5.81)
RDW: 12.9 % (ref 11.5–15.5)
WBC: 8.3 10*3/uL (ref 4.0–10.5)

## 2015-08-31 LAB — LIPASE, BLOOD: Lipase: 22 U/L (ref 11–51)

## 2015-08-31 MED ORDER — ONDANSETRON HCL 4 MG/2ML IJ SOLN
4.0000 mg | Freq: Once | INTRAMUSCULAR | Status: AC
  Administered 2015-08-31: 4 mg via INTRAVENOUS
  Filled 2015-08-31: qty 2

## 2015-08-31 MED ORDER — MECLIZINE HCL 25 MG PO TABS
25.0000 mg | ORAL_TABLET | Freq: Once | ORAL | Status: AC
Start: 2015-08-31 — End: 2015-08-31
  Administered 2015-08-31: 25 mg via ORAL
  Filled 2015-08-31: qty 1

## 2015-08-31 MED ORDER — DIAZEPAM 5 MG PO TABS
5.0000 mg | ORAL_TABLET | Freq: Once | ORAL | Status: AC
  Administered 2015-08-31: 5 mg via ORAL
  Filled 2015-08-31: qty 1

## 2015-08-31 MED ORDER — MECLIZINE HCL 25 MG PO TABS: 25.0000 mg | ORAL_TABLET | Freq: Three times a day (TID) | ORAL | Status: DC | PRN

## 2015-08-31 MED ORDER — LORAZEPAM 2 MG/ML IJ SOLN
1.0000 mg | Freq: Once | INTRAMUSCULAR | Status: AC
  Administered 2015-08-31: 1 mg via INTRAVENOUS
  Filled 2015-08-31: qty 1

## 2015-08-31 NOTE — ED Notes (Signed)
Pt stable, ambulatory, states understanding of discharge instructions 

## 2015-08-31 NOTE — ED Provider Notes (Addendum)
Patient's MRI is negative. Small angioma seen, made patient/wife aware, f/u with PCP. He can get up but is still dizzy. Can take some steps in ED but feels unsteady. He wants to go home. Has help at home, seems reliable. D/c with antivert (nothing in particular helped while here). Discussed return precautions.  Sherwood Gambler, MD 08/31/15 1922   Results for orders placed or performed during the hospital encounter of 08/31/15  Lipase, blood  Result Value Ref Range   Lipase 22 11 - 51 U/L  Comprehensive metabolic panel  Result Value Ref Range   Sodium 138 135 - 145 mmol/L   Potassium 3.6 3.5 - 5.1 mmol/L   Chloride 104 101 - 111 mmol/L   CO2 24 22 - 32 mmol/L   Glucose, Bld 155 (H) 65 - 99 mg/dL   BUN 15 6 - 20 mg/dL   Creatinine, Ser 1.07 0.61 - 1.24 mg/dL   Calcium 9.1 8.9 - 10.3 mg/dL   Total Protein 7.3 6.5 - 8.1 g/dL   Albumin 4.1 3.5 - 5.0 g/dL   AST 28 15 - 41 U/L   ALT 33 17 - 63 U/L   Alkaline Phosphatase 71 38 - 126 U/L   Total Bilirubin 0.8 0.3 - 1.2 mg/dL   GFR calc non Af Amer >60 >60 mL/min   GFR calc Af Amer >60 >60 mL/min   Anion gap 10 5 - 15  CBC  Result Value Ref Range   WBC 8.3 4.0 - 10.5 K/uL   RBC 4.76 4.22 - 5.81 MIL/uL   Hemoglobin 14.0 13.0 - 17.0 g/dL   HCT 42.3 39.0 - 52.0 %   MCV 88.9 78.0 - 100.0 fL   MCH 29.4 26.0 - 34.0 pg   MCHC 33.1 30.0 - 36.0 g/dL   RDW 12.9 11.5 - 15.5 %   Platelets 167 150 - 400 K/uL  Urinalysis, Routine w reflex microscopic  Result Value Ref Range   Color, Urine YELLOW YELLOW   APPearance HAZY (A) CLEAR   Specific Gravity, Urine 1.021 1.005 - 1.030   pH 7.5 5.0 - 8.0   Glucose, UA NEGATIVE NEGATIVE mg/dL   Hgb urine dipstick NEGATIVE NEGATIVE   Bilirubin Urine NEGATIVE NEGATIVE   Ketones, ur 15 (A) NEGATIVE mg/dL   Protein, ur NEGATIVE NEGATIVE mg/dL   Nitrite NEGATIVE NEGATIVE   Leukocytes, UA NEGATIVE NEGATIVE   Ct Head Wo Contrast  08/31/2015  CLINICAL DATA:  Headache, dizziness today EXAM: CT HEAD WITHOUT  CONTRAST TECHNIQUE: Contiguous axial images were obtained from the base of the skull through the vertex without intravenous contrast. COMPARISON:  None. FINDINGS: No acute intracranial abnormality. Specifically, no hemorrhage, hydrocephalus, mass lesion, acute infarction, or significant intracranial injury. No acute calvarial abnormality. Visualized paranasal sinuses and mastoids clear. Orbital soft tissues unremarkable. IMPRESSION: Normal study. Electronically Signed   By: Rolm Baptise M.D.   On: 08/31/2015 15:42   Mr Brain Wo Contrast  08/31/2015  CLINICAL DATA:  Woke up 12 hours ago with acute onset of vertigo. EXAM: MRI HEAD WITHOUT CONTRAST TECHNIQUE: Multiplanar, multiecho pulse sequences of the brain and surrounding structures were obtained without intravenous contrast. COMPARISON:  Head CT same day FINDINGS: Diffusion imaging does not show any acute or subacute infarction. The brainstem is normal. No cerebellar abnormality. Cerebral hemispheres not show any evidence of old small or large vessel infarction. 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 No mass effect or edema. No neoplastic mass  lesion, evidence of acute hemorrhage, hydrocephalus or extra-axial collection. Paranasal sinuses are clear. There is some fluid in the mastoid air cells on the left which could possibly relate to this clinical presentation. No skull or skullbase lesion. Major vessels at the base the brain show flow. IMPRESSION: No acute infarction or other acute intracranial finding. 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 Small mastoid effusion on the left. Electronically Signed   By: Nelson Chimes M.D.   On: 08/31/2015 19:00      Sherwood Gambler, MD 08/31/15 [phone]

## 2015-08-31 NOTE — ED Provider Notes (Signed)
CSN: [id number]     Arrival date & time 08/31/15  1132 History   First MD Initiated Contact with Patient 08/31/15 1230     Chief Complaint  Patient presents with  . Dizziness  . Emesis      HPI Patient began having dizziness today with associated nausea vomiting.  He's never had symptoms like this before.  He denies weakness of his arms or legs.  He feels though things are spinning in front of him.  He feels better when his eyes are closed.  No recent illness.  No recent head trauma.  Denies fevers or chills or neck pain.  No chest pain or shortness of breath.  Denies abdominal pain.  Symptoms are moderate to severe in severity.   Past Medical History  Diagnosis Date  . Colon polyps 2010  . GERD (gastroesophageal reflux disease)   . HLD (hyperlipidemia)    No past surgical history on file. Family History  Problem Relation Age of Onset  . Diabetes Sister   . Heart disease Father   . Brain cancer Mother     chemical exposure   Social History  Substance Use Topics  . Smoking status: Never Smoker   . Smokeless tobacco: Never Used  . Alcohol Use: 0.0 oz/week    0 Standard drinks or equivalent per week     Comment: 1-2 per day    Review of Systems  All other systems reviewed and are negative.     Allergies  Scallops  Home Medications   Prior to Admission medications   Medication Sig Start Date End Date Taking? Authorizing Provider  aspirin 81 MG chewable tablet Chew 1 tablet (81 mg total) by mouth daily. 05/03/15   Tereasa Coop, PA-C  HYDROcodone-acetaminophen (NORCO) 5-325 MG tablet Take 1 tablet by mouth every 6 (six) hours as needed (For severe pain only.). For severe pain only. 04/20/15   Tereasa Coop, PA-C  naproxen (NAPROSYN) 500 MG tablet Take 1 tablet (500 mg total) by mouth 2 (two) times daily with a meal. Do not take with prednisone. 04/20/15   Tereasa Coop, PA-C  pantoprazole (PROTONIX) 20 MG tablet Take 1 tablet (20 mg total) by mouth 2 (two) times  daily. 06/26/15   Tereasa Coop, PA-C  rosuvastatin (CRESTOR) 10 MG tablet Take 1 tablet (10 mg total) by mouth daily. 08/07/15   Tereasa Coop, PA-C   BP 133/86 mmHg  Pulse 56  Temp(Src) 97.4 F (36.3 C) (Oral)  Resp 16  Ht 5\' 4"  (1.626 m)  Wt 226 lb (102.513 kg)  BMI 38.77 kg/m2  SpO2 100% Physical Exam  Constitutional: He is oriented to person, place, and time. He appears well-developed and well-nourished.  HENT:  Head: Normocephalic and atraumatic.  Eyes: EOM are normal. Pupils are equal, round, and reactive to light.  Neck: Normal range of motion.  Cardiovascular: Normal rate, regular rhythm, normal heart sounds and intact distal pulses.   Pulmonary/Chest: Effort normal and breath sounds normal. No respiratory distress.  Abdominal: Soft. He exhibits no distension. There is no tenderness.  Musculoskeletal: Normal range of motion.  Neurological: He is alert and oriented to person, place, and time.  5/5 strength in major muscle groups of  bilateral upper and lower extremities. Speech normal. No facial asymetry.   Skin: Skin is warm and dry.  Psychiatric: He has a normal mood and affect. Judgment normal.  Nursing note and vitals reviewed.   ED Course  Procedures (including critical care  time) Labs Review Labs Reviewed  COMPREHENSIVE METABOLIC PANEL - Abnormal; Notable for the following:    Glucose, Bld 155 (*)    All other components within normal limits  LIPASE, BLOOD  CBC  URINALYSIS, ROUTINE W REFLEX MICROSCOPIC (NOT AT [facility])    Imaging Review Ct Head Wo Contrast  08/31/2015  CLINICAL DATA:  Headache, dizziness today EXAM: CT HEAD WITHOUT CONTRAST TECHNIQUE: Contiguous axial images were obtained from the base of the skull through the vertex without intravenous contrast. COMPARISON:  None. FINDINGS: No acute intracranial abnormality. Specifically, no hemorrhage, hydrocephalus, mass lesion, acute infarction, or significant intracranial injury. No acute calvarial  abnormality. Visualized paranasal sinuses and mastoids clear. Orbital soft tissues unremarkable. IMPRESSION: Normal study. Electronically Signed   By: Rolm Baptise M.D.   On: 08/31/2015 15:42   I have personally reviewed and evaluated these images and lab results as part of my medical decision-making.   EKG Interpretation   Date/Time:  Monday August 31 2015 11:41:43 EDT Ventricular Rate:  57 PR Interval:  134 QRS Duration: 110 QT Interval:  456 QTC Calculation: 443 R Axis:   41 Text Interpretation:  Sinus bradycardia Otherwise normal ECG No old  tracing to compare Confirmed by Jadelynn Boylan  MD, Lennette Bihari ([id number]) on 08/31/2015  1:52:11 PM      MDM   Final diagnoses:  None    4:36 PM Patient's nausea is improved however he continues to feel dizzy and now describes a vertical sense to the movement of the room.  Patient will undergo MRI of his brain at this time to evaluate for central vertigo given his persistence of symptoms through meclizine and Ativan.  Care to Dr Emiliano Dyer, MD 08/31/15 [phone]

## 2015-08-31 NOTE — ED Notes (Signed)
Dizzy y lightheaded vomiitng since this am pt cool clammy vomiting at triage

## 2015-09-03 ENCOUNTER — Telehealth: Payer: Self-pay | Admitting: *Deleted

## 2015-09-03 ENCOUNTER — Ambulatory Visit (INDEPENDENT_AMBULATORY_CARE_PROVIDER_SITE_OTHER): Payer: BC Managed Care – PPO | Admitting: Physician Assistant

## 2015-09-03 ENCOUNTER — Other Ambulatory Visit: Payer: Self-pay | Admitting: *Deleted

## 2015-09-03 ENCOUNTER — Telehealth: Payer: Self-pay

## 2015-09-03 VITALS — BP 132/90 | HR 74 | Temp 97.6°F | Resp 16 | Ht 64.0 in | Wt 226.0 lb

## 2015-09-03 DIAGNOSIS — M5431 Sciatica, right side: Secondary | ICD-10-CM

## 2015-09-03 DIAGNOSIS — H7492 Unspecified disorder of left middle ear and mastoid: Secondary | ICD-10-CM | POA: Diagnosis not present

## 2015-09-03 DIAGNOSIS — M5432 Sciatica, left side: Secondary | ICD-10-CM

## 2015-09-03 DIAGNOSIS — R42 Dizziness and giddiness: Secondary | ICD-10-CM | POA: Diagnosis not present

## 2015-09-03 LAB — POCT SEDIMENTATION RATE: POCT SED RATE: 30 mm/hr — AB (ref 0–22)

## 2015-09-03 MED ORDER — MECLIZINE HCL 25 MG PO TABS: 25.0000 mg | ORAL_TABLET | Freq: Three times a day (TID) | ORAL | Status: DC | PRN

## 2015-09-03 MED ORDER — AMOXICILLIN-POT CLAVULANATE 875-125 MG PO TABS: 1.0000 | ORAL_TABLET | Freq: Two times a day (BID) | ORAL | Status: DC

## 2015-09-03 MED ORDER — PREDNISONE 20 MG PO TABS: ORAL_TABLET | ORAL | Status: DC

## 2015-09-03 MED ORDER — PREDNISONE 20 MG PO TABS: ORAL_TABLET | ORAL | Status: AC

## 2015-09-03 NOTE — Progress Notes (Signed)
09/03/2015 2:30 PM   DOB: 27-Apr-1955 / MRN: [id number]  SUBJECTIVE:  Drew [name] is a 60 y.o. male presenting for hospital follow up of vertigo.  Patient presented to the ED three days ago with severe dizziness and emesis.  Vertical nystagmus was noted and he eventually received an MRI which showed an incidental brain angioma and a left sided mastoid effusion.  He was treated with Meclizine and advised to follow up here. He reports the dizziness is mildly better with Meclizine, however he did have an episode last night that lasted an hour.  He did Epley's and took Meclizine and the problem eventually resolved.  He does report the dizziness is worse with lying on his left side or sudden head movements.   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  He has seen orthopedics who did corticosteroid injections with was of mild relief.  He has pain medication that he only takes if he simply can not move.  He is not a diabetic.   He is allergic to scallops.   He  has a past medical history of Colon polyps (2010); GERD (gastroesophageal reflux disease); and HLD (hyperlipidemia).    He  reports that he has never smoked. He has never used smokeless tobacco. He reports that he drinks alcohol. He reports that he does not use illicit drugs. He  has no sexual activity history on file. The patient  has no past surgical history on file.  His family history includes Brain cancer in his mother; Diabetes in his sister; Heart disease in his father.  Review of Systems  Constitutional: Negative for fever.  Eyes: Negative for blurred vision.  Respiratory: Negative for cough.   Cardiovascular: Negative for chest pain.  Genitourinary: Negative for dysuria.  Skin: Negative for rash.  Neurological: Positive for dizziness. Negative for headaches.  Psychiatric/Behavioral: Negative for depression.    Problem list and medications reviewed and updated by myself where  necessary, and exist elsewhere in the encounter.   OBJECTIVE:  BP 132/90 mmHg  Pulse 74  Temp(Src) 97.6 F (36.4 C) (Oral)  Resp 16  Ht 5\' 4"  (1.626 m)  Wt 226 lb (102.513 kg)  BMI 38.77 kg/m2  SpO2 96%  Physical Exam  Constitutional: He is oriented to person, place, and time. He appears well-developed. He does not appear ill.  Eyes: Conjunctivae and EOM are normal. Pupils are equal, round, and reactive to light.  Cardiovascular: Normal rate.   Pulmonary/Chest: Effort normal.  Abdominal: He exhibits no distension.  Musculoskeletal: Normal range of motion.  Lymphadenopathy:       Head (right side): No submental, no submandibular, no tonsillar, no preauricular, no posterior auricular and no occipital adenopathy present.       Head (left side): No submental, no submandibular, no tonsillar, no preauricular, no posterior auricular and no occipital adenopathy present.    He has no cervical adenopathy.  Neurological: He is alert and oriented to person, place, and time. No cranial nerve deficit. Coordination normal.  Skin: Skin is warm and dry. He is not diaphoretic.  Psychiatric: He has a normal mood and affect.  Nursing note and vitals reviewed.   Results for orders placed or performed in visit on 09/03/15 (from the past 72 hour(s))  POCT SEDIMENTATION RATE     Status: Abnormal   Collection Time: 09/03/15  2:17 PM  Result Value Ref Range   POCT SED RATE 30 (A) 0 - 22  mm/hr   Lab Results  Component Value Date   WBC 8.3 08/31/2015   HGB 14.0 08/31/2015   HCT 42.3 08/31/2015   MCV 88.9 08/31/2015   PLT 167 08/31/2015   Lab Results  Component Value Date   HGBA1C 5.8 04/20/2015     No results found.  ASSESSMENT AND PLAN  Drew [name] was seen today for dizziness and follow-up.  Diagnoses and all orders for this visit:  Vertigo  Mastoid disorder, left: MRI showing left sided mastoid effusion.  Will treat for bacterial.  Given his complaint of tinnitus and dizziness he may be  having Meniere's.  Will start pred.  This should also help with sciatica.  -     amoxicillin-clavulanate (AUGMENTIN) 875-125 MG tablet; Take 1 tablet by mouth 2 (two) times daily. -     POCT SEDIMENTATION RATE  Bilateral sciatica -     predniSONE (DELTASONE) 20 MG tablet; Take 3 in the morning for 3 days, then 2 in the morning for 3 days, and then 1 in the morning for 3 days.    The patient was advised to call or return to clinic if he does not see an improvement in symptoms or to seek the care of the closest emergency department if he worsens with the above plan.   Philis Fendt, MHS, PA-C [facility] 09/03/2015 2:30 PM

## 2015-09-03 NOTE — Telephone Encounter (Signed)
Sent meds into wrong pharmacy.  Rxs resent.

## 2015-09-03 NOTE — Telephone Encounter (Signed)
Duplicate message. This was done.

## 2015-09-03 NOTE — Patient Instructions (Signed)
     IF you received an x-ray today, you will receive an invoice from [facility]. Please contact [facility] at [phone] with questions or concerns regarding your invoice.   IF you received labwork today, you will receive an invoice from [facility]. Please contact [facility] at [phone] with questions or concerns regarding your invoice.   Our billing staff will not be able to assist you with questions regarding bills from these companies.  You will be contacted with the lab results as soon as they are available. The fastest way to get your results is to activate your My Chart account. Instructions are located on the last page of this paperwork. If you have not heard from us regarding the results in 2 weeks, please contact this office.      

## 2015-09-03 NOTE — Telephone Encounter (Signed)
Patient's prescriptions were went to [facility] in [place]. Patient wanted them sent to [facility] on [address]. Also patient states there is supposed to be a 3rd prescription sent in for vertigo. The medication sent are amoxicillin and prednisone. Please send and transfer meds. [facility] on [address]

## 2015-10-30 ENCOUNTER — Ambulatory Visit (INDEPENDENT_AMBULATORY_CARE_PROVIDER_SITE_OTHER): Payer: BC Managed Care – PPO | Admitting: Physician Assistant

## 2015-10-30 DIAGNOSIS — B351 Tinea unguium: Secondary | ICD-10-CM | POA: Diagnosis not present

## 2015-10-30 DIAGNOSIS — M79674 Pain in right toe(s): Secondary | ICD-10-CM

## 2015-10-30 DIAGNOSIS — Z23 Encounter for immunization: Secondary | ICD-10-CM

## 2015-10-30 DIAGNOSIS — M47819 Spondylosis without myelopathy or radiculopathy, site unspecified: Secondary | ICD-10-CM

## 2015-10-30 MED ORDER — HYDROCODONE-ACETAMINOPHEN 5-325 MG PO TABS: 1.0000 | ORAL_TABLET | Freq: Four times a day (QID) | ORAL | 0 refills | Status: DC | PRN

## 2015-10-30 MED ORDER — PREDNISONE 20 MG PO TABS: 20.0000 mg | ORAL_TABLET | Freq: Every day | ORAL | 0 refills | Status: DC

## 2015-10-30 MED ORDER — FLUCONAZOLE 150 MG PO TABS: ORAL_TABLET | ORAL | 0 refills | Status: DC

## 2015-10-30 NOTE — Progress Notes (Signed)
10/30/2015 5:36 PM   DOB: 1955/06/30 / MRN: [id number]  SUBJECTIVE:  Drew [name] is a 61 y.o. male presenting for a left toe problem that started 9 months ago after he damaged the toenail. Complains he has had several nails grow in since and now has two nail is causing him pain.  He has a history of fungal nails and has been treated with daily pills which he did not take consistently.   He continues to have back pain.  He recently moved and has had to pack and unpack and this has made his back worse.  He has tried hydrocodone 2.5-165 and this helps. He last prescription was roughly 8 months ago by me and he takes this only for emergency relief.   He needs a TDAP today.  No records exist showing he is either due.  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   Immunization History  Administered Date(s) Administered  . Influenza,inj,Quad PF,36+ Mos 04/20/2015  . Tdap 10/30/2015       He is allergic to scallops [shellfish allergy].   He  has a past medical history of Colon polyps (2010); GERD (gastroesophageal reflux disease); and HLD (hyperlipidemia).    He  reports that he has never smoked. He has never used smokeless tobacco. He reports that he drinks alcohol. He reports that he does not use drugs. He  has no sexual activity history on file. The patient  has no past surgical history on file.  His family history includes Brain cancer in his mother; Diabetes in his sister; Heart disease in his father.  Review of Systems  Constitutional: Negative for chills and fever.  Cardiovascular: Negative for chest pain.  Musculoskeletal: Positive for back pain and myalgias.  Skin: Negative for itching and rash.  Neurological: Negative for dizziness and headaches.  Psychiatric/Behavioral: Negative for depression.    The problem list and medications were reviewed and updated by myself where necessary and exist elsewhere in the encounter.   OBJECTIVE:  There were no vitals taken for this  visit.  Physical Exam  Constitutional: He is oriented to person, place, and time. He appears well-developed. He does not appear ill.  Eyes: Conjunctivae and EOM are normal. Pupils are equal, round, and reactive to light.  Cardiovascular: Normal rate.   Pulmonary/Chest: Effort normal.  Abdominal: He exhibits no distension.  Musculoskeletal: Normal range of motion.       Feet:  Neurological: He is alert and oriented to person, place, and time. No cranial nerve deficit. Coordination normal.  Skin: Skin is warm and dry. He is not diaphoretic.  Psychiatric: He has a normal mood and affect.  Nursing note and vitals reviewed.   Lab Results  Component Value Date   ALT 33 08/31/2015   AST 28 08/31/2015   ALKPHOS 71 08/31/2015   BILITOT 0.8 08/31/2015   Lab Results  Component Value Date   HGBA1C 5.8 04/20/2015     No results found for this or any previous visit (from the past 72 hour(s)).  No results found.  ASSESSMENT AND PLAN  Drew [name] was seen today for back pain and toe pain.  Diagnoses and all orders for this visit:  Need for Tdap vaccination -     Tdap vaccine greater than or equal to 7yo IM  Spondylarthritis -     HYDROcodone-acetaminophen (NORCO) 5-325 MG tablet; Take 1 tablet by mouth every 6 (six) hours as needed (For severe pain only.). For severe pain only. -  predniSONE (DELTASONE) 20 MG tablet; Take 1 tablet (20 mg total) by mouth daily with breakfast. Take 4 tabs daily for three day, then take 3 tabs for 3 days, then 2 tabs for 3 days, then 1 tab for 3 days.  Onychomycosis: Left great toe nail removed.  Will start weekly fluconazole to cure the problem.   -     fluconazole (DIFLUCAN) 150 MG tablet; Take one weekly for eight weeks.    The patient is advised to call or return to clinic if he does not see an improvement in symptoms, or to seek the care of the closest emergency department if he worsens with the above plan.   Philis Fendt, MHS, PA-C [facility] 10/30/2015 5:36 PM

## 2015-10-30 NOTE — Progress Notes (Signed)
Procedure:  Consent obtained.  Digital block with lidocaine and marcaine.  Betadine prep.  Nail removed and xeroform gauzed placed.  Wound care d/w pt.

## 2015-10-30 NOTE — Patient Instructions (Addendum)
  INGROWN TOENAIL . Keep area clean, dry and bandaged for 24 hours. . After 24 hours, remove outer bandage and leave yellow gauze in place. Domingo Madeira toe/foot in warm soapy water for 5-10 minutes, once daily for 5 days. Rebandage toe after each cleaning. . Continue soaks until yellow gauze falls off. . Notify the office if you experience any of the following signs of infection: Swelling, redness, pus drainage, streaking, fever > 101.0 F     IF you received an x-ray today, you will receive an invoice from [facility]. Please contact [facility] at [phone] with questions or concerns regarding your invoice.   IF you received labwork today, you will receive an invoice from [employer]. Please contact [facility] at [phone] with questions or concerns regarding your invoice.   Our billing staff will not be able to assist you with questions regarding bills from these companies.  You will be contacted with the lab results as soon as they are available. The fastest way to get your results is to activate your My Chart account. Instructions are located on the last page of this paperwork. If you have not heard from [place] regarding the results in 2 weeks, please contact this office.

## 2016-05-04 ENCOUNTER — Other Ambulatory Visit: Payer: Self-pay | Admitting: Physician Assistant

## 2016-05-04 DIAGNOSIS — Z299 Encounter for prophylactic measures, unspecified: Secondary | ICD-10-CM

## 2016-05-04 NOTE — Telephone Encounter (Signed)
Patient is due for f/u, last OV 03/2015. He is aware and plans to set up an appointment soon. 90 day supply provided.

## 2016-06-02 ENCOUNTER — Ambulatory Visit (INDEPENDENT_AMBULATORY_CARE_PROVIDER_SITE_OTHER): Payer: BC Managed Care – PPO | Admitting: Physician Assistant

## 2016-06-02 VITALS — BP 126/78 | HR 89 | Temp 98.4°F | Resp 18 | Ht 64.0 in | Wt 231.6 lb

## 2016-06-02 DIAGNOSIS — M4690 Unspecified inflammatory spondylopathy, site unspecified: Secondary | ICD-10-CM

## 2016-06-02 DIAGNOSIS — R0683 Snoring: Secondary | ICD-10-CM

## 2016-06-02 DIAGNOSIS — Z23 Encounter for immunization: Secondary | ICD-10-CM

## 2016-06-02 DIAGNOSIS — Z299 Encounter for prophylactic measures, unspecified: Secondary | ICD-10-CM

## 2016-06-02 DIAGNOSIS — M47819 Spondylosis without myelopathy or radiculopathy, site unspecified: Secondary | ICD-10-CM

## 2016-06-02 DIAGNOSIS — E785 Hyperlipidemia, unspecified: Secondary | ICD-10-CM | POA: Diagnosis not present

## 2016-06-02 MED ORDER — HYDROCODONE-ACETAMINOPHEN 5-325 MG PO TABS: 1.0000 | ORAL_TABLET | Freq: Four times a day (QID) | ORAL | 0 refills | Status: DC | PRN

## 2016-06-02 MED ORDER — ROSUVASTATIN CALCIUM 10 MG PO TABS: 10.0000 mg | ORAL_TABLET | Freq: Every day | ORAL | 3 refills | Status: DC

## 2016-06-02 NOTE — Patient Instructions (Addendum)
I'll see you back in about a year or sooner if needed.  Keep up the good work.  Good luck on the sleep study.     IF you received an x-ray today, you will receive an invoice from [facility]. Please contact [facility] at [phone] with questions or concerns regarding your invoice.   IF you received labwork today, you will receive an invoice from [facility]. Please contact [facility] at [phone] with questions or concerns regarding your invoice.   Our billing staff will not be able to assist you with questions regarding bills from these companies.  You will be contacted with the lab results as soon as they are available. The fastest way to get your results is to activate your My Chart account. Instructions are located on the last page of this paperwork. If you have not heard from [place] regarding the results in 2 weeks, please contact this office.

## 2016-06-02 NOTE — Progress Notes (Signed)
06/02/2016 10:08 AM   DOB: [birth year] / MRN: [id number]  SUBJECTIVE:  Drew [name] is a 61 y.o. male presenting for medication refills. He has a history of spondyloarthritis and takes narcotics very sparingly. He remains physically active.   He would like a refill of that today and continues to do overall well. He does complain of some paresthesias of the lower extremities today. He has needed prednisone in the past to treat this. He has an MRI of his back that was taken in early 2016.   He has a history of dyslipidemia and is taking crestor and ASA due to an elevated risk of cardiovascular event.  He is taking 81 mg ASA daily as well.   He has had GERD for "many, many, many years." Takes protonix and is overall asymptomatic. He will occasionally need tums if he eats tomatoes. Denies dysphagia, odynophagia, never smoker, no unintentional weight loss.   Complains of loud and incessant snoring.  He wife is now sleeping in the other room. Goes to bed at 10 or 1-2 am and will wake up due to leg pain or need to urinate every 2-3 hours.  Is able to go right back to sleep.  Denies an SOB with awakening.  Takes one nap however this is 20-30 mins and only on night he is up later.  Denies any real excessive fatigue. Is concerned about his weight and snoring causing an obstructive type of apnea.   He did have shingles in 2007.    He is allergic to scallops [shellfish allergy].   He  has a past medical history of Colon polyps (2010); GERD (gastroesophageal reflux disease); and HLD (hyperlipidemia).    He  reports that he has never smoked. He has never used smokeless tobacco. He reports that he drinks alcohol. He reports that he does not use drugs. He  has no sexual activity history on file. The patient  has no past surgical history on file.  His family history includes Brain cancer in his mother; Diabetes in his sister; Heart disease in his father.  Review of Systems  Constitutional: Negative for fever.   Musculoskeletal: Positive for back pain and myalgias. Negative for falls and joint pain.  Neurological: Negative for dizziness.    The problem list and medications were reviewed and updated by myself where necessary and exist elsewhere in the encounter.   OBJECTIVE:  BP 126/78   Pulse 89   Temp 98.4 F (36.9 C) (Oral)   Resp 18   Ht '5\' 4"'  (1.626 m)   Wt 231 lb 9.6 oz (105.1 kg)   SpO2 96%   BMI 39.75 kg/m   Physical Exam  Constitutional: He is oriented to person, place, and time. He appears well-developed. He does not appear ill.  Eyes: Conjunctivae and EOM are normal. Pupils are equal, round, and reactive to light.  Cardiovascular: Normal rate.   Pulmonary/Chest: Effort normal.  Abdominal: He exhibits no distension.  Musculoskeletal: Normal range of motion.  Neurological: He is alert and oriented to person, place, and time. No cranial nerve deficit. Coordination normal.  Skin: Skin is warm and dry. He is not diaphoretic.  Psychiatric: He has a normal mood and affect.  Nursing note and vitals reviewed.  Lab Results  Component Value Date   ALT 33 08/31/2015   AST 28 08/31/2015   ALKPHOS 71 08/31/2015   BILITOT 0.8 08/31/2015   Lab Results  Component Value Date   CREATININE 1.07 08/31/2015   Lab Results  Component Value Date   CHOL 264 (H) 04/20/2015   HDL 35 (L) 04/20/2015   LDLCALC 159 (H) 04/20/2015   TRIG 352 (H) 04/20/2015   CHOLHDL 7.5 (H) 04/20/2015   Lab Results  Component Value Date   HGBA1C 5.8 04/20/2015   Wt Readings from Last 3 Encounters:  06/02/16 231 lb 9.6 oz (105.1 kg)  09/03/15 226 lb (102.5 kg)  08/31/15 226 lb (102.5 kg)        No results found for this or any previous visit (from the past 72 hour(s)).  No results found.  ASSESSMENT AND PLAN:  [name] was seen today for medication refill, leg pain, sleep issues and flu vaccine.  Diagnoses and all orders for this visit:  Dyslipidemia: -     Lipid panel -     CMP14+EGFR        -     rosuvastatin (CRESTOR) 10 MG tablet; Take 1 tablet (10 mg total) by mouth daily.  Spondylarthritis [facility]): he will continue conservative management and take narcotics very sparingly.   -     HYDROcodone-acetaminophen (NORCO) 5-325 MG tablet; Take 1 tablet by mouth every 6 (six) hours as needed (For severe pain only.). For severe pain only.  Need for prophylactic measure -     Flu Vaccine QUAD 36+ mos IM  Snores: He has nothing to lose with screening. Advised he seen sleep to rule/out sleep apnea.  -     Ambulatory referral to Neurology    The patient is advised to call or return to clinic if he does not see an improvement in symptoms, or to seek the care of the closest emergency department if he worsens with the above plan.   Philis Fendt, MHS, PA-C [facility] 06/02/2016 10:08 AM

## 2016-06-03 LAB — LIPID PANEL
Chol/HDL Ratio: 4.5 ratio units (ref 0.0–5.0)
Cholesterol, Total: 208 mg/dL — ABNORMAL HIGH (ref 100–199)
HDL: 46 mg/dL (ref >39)
LDL Calculated: 118 mg/dL — ABNORMAL HIGH (ref 0–99)
Triglycerides: 218 mg/dL — ABNORMAL HIGH (ref 0–149)
VLDL Cholesterol Cal: 44 mg/dL — ABNORMAL HIGH (ref 5–40)

## 2016-06-03 LAB — CMP14+EGFR
ALBUMIN: 4.4 g/dL (ref 3.6–4.8)
ALK PHOS: 77 IU/L (ref 39–117)
ALT: 31 IU/L (ref 0–44)
AST: 22 IU/L (ref 0–40)
Albumin/Globulin Ratio: 1.8 (ref 1.2–2.2)
BILIRUBIN TOTAL: 0.6 mg/dL (ref 0.0–1.2)
BUN / CREAT RATIO: 24 (ref 10–24)
BUN: 24 mg/dL (ref 8–27)
CHLORIDE: 101 mmol/L (ref 96–106)
CO2: 24 mmol/L (ref 18–29)
Calcium: 9.8 mg/dL (ref 8.6–10.2)
Creatinine, Ser: 0.98 mg/dL (ref 0.76–1.27)
GFR calc non Af Amer: 82 mL/min/{1.73_m2} (ref >59)
GFR, EST AFRICAN AMERICAN: 95 mL/min/{1.73_m2} (ref >59)
GLUCOSE: 106 mg/dL — AB (ref 65–99)
Globulin, Total: 2.5 g/dL (ref 1.5–4.5)
POTASSIUM: 4.3 mmol/L (ref 3.5–5.2)
Sodium: 141 mmol/L (ref 134–144)
TOTAL PROTEIN: 6.9 g/dL (ref 6.0–8.5)

## 2016-06-15 NOTE — Progress Notes (Signed)
Labs look good Shanon Brow.  Keep up the good work. Philis Fendt, MS, PA-C 2:20 PM, 06/15/2016

## 2016-07-12 ENCOUNTER — Institutional Professional Consult (permissible substitution): Payer: BC Managed Care – PPO | Admitting: Neurology

## 2016-07-12 ENCOUNTER — Telehealth: Payer: Self-pay

## 2016-07-12 NOTE — Telephone Encounter (Signed)
I called patient to r/s his appt due to bad weather. He was unable to r/s at this time but states that he will call us back tomorrow and r/s.

## 2016-07-16 ENCOUNTER — Other Ambulatory Visit: Payer: Self-pay | Admitting: Urgent Care

## 2016-07-16 ENCOUNTER — Other Ambulatory Visit: Payer: Self-pay | Admitting: Physician Assistant

## 2016-07-16 DIAGNOSIS — Z760 Encounter for issue of repeat prescription: Secondary | ICD-10-CM

## 2016-07-16 DIAGNOSIS — Z299 Encounter for prophylactic measures, unspecified: Secondary | ICD-10-CM

## 2016-10-19 ENCOUNTER — Other Ambulatory Visit: Payer: Self-pay | Admitting: Urgent Care

## 2016-10-19 DIAGNOSIS — Z299 Encounter for prophylactic measures, unspecified: Secondary | ICD-10-CM

## 2016-10-20 ENCOUNTER — Other Ambulatory Visit: Payer: Self-pay

## 2016-10-20 DIAGNOSIS — E785 Hyperlipidemia, unspecified: Secondary | ICD-10-CM

## 2016-10-20 DIAGNOSIS — Z299 Encounter for prophylactic measures, unspecified: Secondary | ICD-10-CM

## 2016-10-20 MED ORDER — ROSUVASTATIN CALCIUM 10 MG PO TABS: 10.0000 mg | ORAL_TABLET | Freq: Every day | ORAL | 2 refills | Status: DC

## 2017-04-29 ENCOUNTER — Other Ambulatory Visit: Payer: Self-pay | Admitting: Physician Assistant

## 2017-04-29 DIAGNOSIS — Z299 Encounter for prophylactic measures, unspecified: Secondary | ICD-10-CM

## 2017-05-01 NOTE — Telephone Encounter (Signed)
Attempted to contact pt regarding refill request for crestor; per prescription note pt needs appointment for refills; left message on voice mail  [phone].

## 2017-05-27 ENCOUNTER — Ambulatory Visit: Payer: BC Managed Care – PPO | Admitting: Physician Assistant

## 2017-05-27 ENCOUNTER — Other Ambulatory Visit: Payer: Self-pay

## 2017-05-27 ENCOUNTER — Encounter: Payer: Self-pay | Admitting: Physician Assistant

## 2017-05-27 VITALS — BP 136/88 | HR 73 | Temp 97.8°F | Resp 18 | Ht 64.0 in | Wt 235.4 lb

## 2017-05-27 DIAGNOSIS — M47819 Spondylosis without myelopathy or radiculopathy, site unspecified: Secondary | ICD-10-CM

## 2017-05-27 DIAGNOSIS — E785 Hyperlipidemia, unspecified: Secondary | ICD-10-CM

## 2017-05-27 DIAGNOSIS — K219 Gastro-esophageal reflux disease without esophagitis: Secondary | ICD-10-CM

## 2017-05-27 DIAGNOSIS — R7989 Other specified abnormal findings of blood chemistry: Secondary | ICD-10-CM

## 2017-05-27 DIAGNOSIS — Z1211 Encounter for screening for malignant neoplasm of colon: Secondary | ICD-10-CM

## 2017-05-27 DIAGNOSIS — Z23 Encounter for immunization: Secondary | ICD-10-CM

## 2017-05-27 MED ORDER — HYDROCODONE-ACETAMINOPHEN 5-325 MG PO TABS: 1.0000 | ORAL_TABLET | Freq: Four times a day (QID) | ORAL | 0 refills | Status: DC | PRN

## 2017-05-27 MED ORDER — PANTOPRAZOLE SODIUM 20 MG PO TBEC: 20.0000 mg | DELAYED_RELEASE_TABLET | Freq: Two times a day (BID) | ORAL | 3 refills | Status: DC

## 2017-05-27 MED ORDER — ROSUVASTATIN CALCIUM 10 MG PO TABS: 10.0000 mg | ORAL_TABLET | Freq: Every day | ORAL | 3 refills | Status: DC

## 2017-05-27 MED ORDER — CYCLOBENZAPRINE HCL 10 MG PO TABS: 5.0000 mg | ORAL_TABLET | Freq: Every day | ORAL | 1 refills | Status: DC

## 2017-05-27 NOTE — Patient Instructions (Signed)
     IF you received an x-ray today, you will receive an invoice from [facility]. Please contact [facility] at [phone] with questions or concerns regarding your invoice.   IF you received labwork today, you will receive an invoice from [facility]. Please contact [facility] at [phone] with questions or concerns regarding your invoice.   Our billing staff will not be able to assist you with questions regarding bills from these companies.  You will be contacted with the lab results as soon as they are available. The fastest way to get your results is to activate your My Chart account. Instructions are located on the last page of this paperwork. If you have not heard from us regarding the results in 2 weeks, please contact this office.     

## 2017-05-27 NOTE — Progress Notes (Signed)
05/28/2017 10:06 AM   DOB: October 28, 1955 / MRN: [id number]  SUBJECTIVE:  Drew [name] is a 62 y.o. male presenting for multiple problems.   Long history of back pain.  Can't take NSAID'S due to gastritis.  He has an MRI of his back showing degenerative disc disease and arthritis at several lumbar levels.  He takes narcotic pain medication very sparingly.  He does take Tylenol 1000 mg every 8 hours.  He feels his pain is progressing.  He does not want to go the route of orthopedics yet given fear of surgery.  He has not been tried on Flexeril yet.  With regard to his gastritis he is taking pantoprazole 20 mg about 3-5 minutes before eating the first meal of the day.  He states that his gastritis has been more poorly controlled lately.  He denies dysphagia, odynophagia, weight loss and he is a never smoker.  He denies blood in the stool.  He has a upper endoscopy and colonoscopy which was done at the [facility] clinic in 2009.  He is willing to go back for reevaluation repeat colonoscopy.   He snores loudly.  His sleep is often interrupted however he tells me this can often be due to back pain.  He does wake up well rested.  He is never had any witnessed apneas.  He is allergic to scallops [shellfish allergy].   He  has a past medical history of Colon polyps (2010), GERD (gastroesophageal reflux disease), and HLD (hyperlipidemia).    He  reports that  has never smoked. he has never used smokeless tobacco. He reports that he drinks alcohol. He reports that he does not use drugs. He  has no sexual activity history on file. The patient  has no past surgical history on file.  His family history includes Brain cancer in his mother; Diabetes in his sister; Heart disease in his father.  Review of Systems  Constitutional: Negative for chills, diaphoresis and fever.  Eyes: Negative.   Respiratory: Negative for cough, hemoptysis, sputum production, shortness of breath and wheezing.   Cardiovascular:  Negative for chest pain, orthopnea and leg swelling.  Gastrointestinal: Negative for abdominal pain and nausea.  Skin: Negative for rash.  Neurological: Negative for dizziness, sensory change, speech change, focal weakness and headaches.    The problem list and medications were reviewed and updated by myself where necessary and exist elsewhere in the encounter.   OBJECTIVE:  BP 136/88   Pulse 73   Temp 97.8 F (36.6 C) (Oral)   Resp 18   Ht 5\' 4"  (1.626 m)   Wt 235 lb 6.4 oz (106.8 kg)   SpO2 95%   BMI 40.41 kg/m   Lab Results  Component Value Date   CREATININE 0.98 06/02/2016   BUN 24 06/02/2016   NA 141 06/02/2016   K 4.3 06/02/2016   CL 101 06/02/2016   CO2 24 06/02/2016   Lab Results  Component Value Date   CHOL 170 05/27/2017   HDL 40 05/27/2017   LDLCALC 69 05/27/2017   TRIG 305 (H) 05/27/2017   CHOLHDL 4.3 05/27/2017   The 10-year ASCVD risk score Mikey Bussing DC Jr., et al., 2013) is: 12.3%   Values used to calculate the score:     Age: 63 years     Sex: Male     Is Non-Hispanic African American: No     Diabetic: No     Tobacco smoker: No     Systolic Blood Pressure: 122  mmHg     Is BP treated: No     HDL Cholesterol: 40 mg/dL     Total Cholesterol: 170 mg/dL   Physical Exam  Constitutional: He appears well-developed. He is active and cooperative.  Non-toxic appearance.  Cardiovascular: Normal rate, regular rhythm, S1 normal, S2 normal, normal heart sounds, intact distal pulses and normal pulses. Exam reveals no gallop and no friction rub.  No murmur heard. Pulmonary/Chest: Effort normal. No stridor. No tachypnea. No respiratory distress. He has no wheezes. He has no rales.  Abdominal: He exhibits no distension.  Musculoskeletal: He exhibits no edema.  Neurological: He is alert.  Skin: Skin is warm and dry. He is not diaphoretic. No pallor.  Vitals reviewed.   Results for orders placed or performed in visit on 05/27/17 (from the past 72 hour(s))  CBC      Status: None   Collection Time: 05/27/17  3:11 PM  Result Value Ref Range   WBC 7.3 3.4 - 10.8 x10E3/uL   RBC 4.81 4.14 - 5.80 x10E6/uL   Hemoglobin 14.6 13.0 - 17.7 g/dL   Hematocrit 43.3 37.5 - 51.0 %   MCV 90 79 - 97 fL   MCH 30.4 26.6 - 33.0 pg   MCHC 33.7 31.5 - 35.7 g/dL   RDW 13.8 12.3 - 15.4 %   Platelets 185 150 - 379 x10E3/uL  Lipid panel     Status: Abnormal   Collection Time: 05/27/17  3:11 PM  Result Value Ref Range   Cholesterol, Total 170 100 - 199 mg/dL   Triglycerides 305 (H) 0 - 149 mg/dL   HDL 40 >39 mg/dL   VLDL Cholesterol Cal 61 (H) 5 - 40 mg/dL   LDL Calculated 69 0 - 99 mg/dL   Chol/HDL Ratio 4.3 0.0 - 5.0 ratio    Comment:                                   T. Chol/HDL Ratio                                             Men  Women                               1/2 Avg.Risk  3.4    3.3                                   Avg.Risk  5.0    4.4                                2X Avg.Risk  9.6    7.1                                3X Avg.Risk 23.4   11.0   TSH     Status: None (Preliminary result)   Collection Time: 05/27/17  3:11 PM  Result Value Ref Range   TSH WILL FOLLOW   Hemoglobin A1c     Status: None (Preliminary result)   Collection Time: 05/27/17  3:11 PM  Result Value Ref Range   Hgb A1c MFr Bld WILL FOLLOW    Est. average glucose Bld gHb Est-mCnc WILL FOLLOW     No results found.  ASSESSMENT AND PLAN:  [name] was seen today for medication refill.  Diagnoses and all orders for this visit:  Gastroesophageal reflux disease, esophagitis presence not specified: Has been 10 years since his last colonoscopy.  Given his worsening GERD symptoms I have advised that he take his medication 30 minutes or so before breakfast to maximize the effectiveness of his 20 mg of pantoprazole.  I advised him that he can in times of poor control increase his dose to 40 mg 30 minutes before breakfast and then back down to 20 once he achieves better control.  His last  upper endoscopy was negative for dysplasia.  However this again was 10 years ago.  Defer to Dr. Carlean Purl regarding whether this needs to be repeated. -     Ambulatory referral to Gastroenterology -     pantoprazole (PROTONIX) 20 MG tablet; Take 1 tablet (20 mg total) by mouth 2 (two) times daily. -     CBC  Dyslipidemia -     rosuvastatin (CRESTOR) 10 MG tablet; Take 1 tablet (10 mg total) by mouth daily. -     Lipid panel -     TSH -     Hemoglobin A1c  Spondylarthritis -     cyclobenzaprine (FLEXERIL) 10 MG tablet; Take 0.5-1 tablets (5-10 mg total) by mouth at bedtime. -     Discontinue: HYDROcodone-acetaminophen (NORCO) 5-325 MG tablet; Take 1 tablet by mouth every 6 (six) hours as needed (For severe pain only.). For severe pain only. -     Ambulatory referral to Physical Therapy -     Discontinue: HYDROcodone-acetaminophen (NORCO) 5-325 MG tablet; Take 1 tablet by mouth every 6 (six) hours as needed (For severe pain only.). For severe pain only. -     HYDROcodone-acetaminophen (NORCO) 5-325 MG tablet; Take 1 tablet by mouth every 6 (six) hours as needed (For severe pain only.). For severe pain only.  Screening for colon cancer -     Ambulatory referral to Gastroenterology  Need for influenza vaccination -     Flu Vaccine QUAD 36+ mos IM    The patient is advised to call or return to clinic if he does not see an improvement in symptoms, or to seek the care of the closest emergency department if he worsens with the above plan.   Philis Fendt, MHS, PA-C [facility] 05/28/2017 10:06 AM

## 2017-05-28 LAB — CBC
Hematocrit: 43.3 % (ref 37.5–51.0)
Hemoglobin: 14.6 g/dL (ref 13.0–17.7)
MCH: 30.4 pg (ref 26.6–33.0)
MCHC: 33.7 g/dL (ref 31.5–35.7)
MCV: 90 fL (ref 79–97)
Platelets: 185 x10E3/uL (ref 150–379)
RBC: 4.81 x10E6/uL (ref 4.14–5.80)
RDW: 13.8 % (ref 12.3–15.4)
WBC: 7.3 x10E3/uL (ref 3.4–10.8)

## 2017-05-28 LAB — LIPID PANEL
Chol/HDL Ratio: 4.3 ratio (ref 0.0–5.0)
Cholesterol, Total: 170 mg/dL (ref 100–199)
HDL: 40 mg/dL
LDL Calculated: 69 mg/dL (ref 0–99)
Triglycerides: 305 mg/dL — ABNORMAL HIGH (ref 0–149)
VLDL Cholesterol Cal: 61 mg/dL — ABNORMAL HIGH (ref 5–40)

## 2017-05-28 LAB — HEMOGLOBIN A1C
Est. average glucose Bld gHb Est-mCnc: 123 mg/dL
Hgb A1c MFr Bld: 5.9 % — ABNORMAL HIGH (ref 4.8–5.6)

## 2017-05-28 LAB — TSH: TSH: 4.74 u[IU]/mL — AB (ref 0.450–4.500)

## 2017-05-29 NOTE — Addendum Note (Signed)
Addended by: Gari Crown D on: 05/29/2017 04:38 PM   Modules accepted: Orders

## 2017-05-29 NOTE — Progress Notes (Signed)
Please add on T4.  Diagnosis elevated TSH.

## 2017-05-30 LAB — T4, FREE: Free T4: 0.94 ng/dL (ref 0.82–1.77)

## 2017-05-31 ENCOUNTER — Encounter: Payer: Self-pay | Admitting: Physician Assistant

## 2017-06-01 ENCOUNTER — Telehealth: Payer: Self-pay

## 2017-06-01 NOTE — Telephone Encounter (Signed)
PA started Hydrocodone Acetaminophen 5-325 Awaiting Response  Your information has been submitted to [facility]. To check for an updated outcome later, reopen this PA request from your dashboard. If [facility] has not responded to your request within 24 hours, contact [facility] at [phone]. If you think there may be a problem with your PA request, use our live chat feature at the bottom right.

## 2017-06-03 NOTE — Telephone Encounter (Signed)
Notice of Approval received. Hydrocodone-Acetaminophen 5-325 Approved 06/01/17 through 07/02/17

## 2017-06-27 ENCOUNTER — Ambulatory Visit (INDEPENDENT_AMBULATORY_CARE_PROVIDER_SITE_OTHER): Payer: No Typology Code available for payment source | Admitting: Family Medicine

## 2017-06-27 ENCOUNTER — Other Ambulatory Visit: Payer: Self-pay

## 2017-06-27 ENCOUNTER — Encounter: Payer: Self-pay | Admitting: Family Medicine

## 2017-06-27 VITALS — BP 134/82 | HR 97 | Temp 97.8°F | Ht 65.75 in | Wt 229.2 lb

## 2017-06-27 DIAGNOSIS — S60021A Contusion of right index finger without damage to nail, initial encounter: Secondary | ICD-10-CM

## 2017-06-27 NOTE — Patient Instructions (Addendum)
     IF you received an x-ray today, you will receive an invoice from [facility]. Please contact [facility] at [phone] with questions or concerns regarding your invoice.   IF you received labwork today, you will receive an invoice from [facility]. Please contact [facility] at [phone] with questions or concerns regarding your invoice.   Our billing staff will not be able to assist you with questions regarding bills from these companies.  You will be contacted with the lab results as soon as they are available. The fastest way to get your results is to activate your My Chart account. Instructions are located on the last page of this paperwork. If you have not heard from [place] regarding the results in 2 weeks, please contact this office.     Contusion A contusion is a deep bruise. Contusions are the result of a blunt injury to tissues and muscle fibers under the skin. The injury causes bleeding under the skin. The skin overlying the contusion may turn blue, purple, or yellow. Minor injuries will give you a painless contusion, but more severe contusions may stay painful and swollen for a few weeks. What are the causes? This condition is usually caused by a blow, trauma, or direct force to an area of the body. What are the signs or symptoms? Symptoms of this condition include:  Swelling of the injured area.  Pain and tenderness in the injured area.  Discoloration. The area may have redness and then turn blue, purple, or yellow.  How is this diagnosed? This condition is diagnosed based on a physical exam and medical history. An X-ray, CT scan, or MRI may be needed to determine if there are any associated injuries, such as broken bones (fractures). How is this treated? Specific treatment for this condition depends on what area of the body was injured. In general, the best treatment for a contusion is resting, icing, applying pressure to (compression), and elevating the  injured area. This is often called the RICE strategy. Over-the-counter anti-inflammatory medicines may also be recommended for pain control. Follow these instructions at home:  Rest the injured area.  If directed, apply ice to the injured area: ? Put ice in a plastic bag. ? Place a towel between your skin and the bag. ? Leave the ice on for 20 minutes, 2-3 times per day.  If directed, apply light compression to the injured area using an elastic bandage. Make sure the bandage is not wrapped too tightly. Remove and reapply the bandage as directed by your health care provider.  If possible, raise (elevate) the injured area above the level of your heart while you are sitting or lying down.  Take over-the-counter and prescription medicines only as told by your health care provider. Contact a health care provider if:  Your symptoms do not improve after several days of treatment.  Your symptoms get worse.  You have difficulty moving the injured area. Get help right away if:  You have severe pain.  You have numbness in a hand or foot.  Your hand or foot turns pale or cold. This information is not intended to replace advice given to you by your health care provider. Make sure you discuss any questions you have with your health care provider. Document Released: 12/22/2004 Document Revised: 07/23/2015 Document Reviewed: 07/30/2014 Elsevier Interactive Patient Education  [employer].

## 2017-06-27 NOTE — Progress Notes (Signed)
     Drew [name] [birth year] 62 y.o.   Chief Complaint  Patient presents with  . Injury    right finger injury either due to lifting heavy pale of water or using industrial cleaner with no glove. Finger is discolored and starting to spread, no pain yet swelling    Presents for evaluation of work-related complaint.  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 He had to carry water from opposite side of building. He tried using a trolley but water was splashing all over the place so he ended up carrying about 40 lbs bucket filled with water for more than half ways. Several hours later he noticed swelling, bruising of palmar side of index finger of right hand. He reports able to move without pain. He denies any numbness or tingling. Patient takes ASA. Patient is right handed.    ROS Per above    Current medications and allergies reviewed and updated. Past medical history, family history, social history have been reviewed and updated.   Physical Exam Gen: AAOx3, NAD Right hand: FROM, NVI, palmar surface, index finger with large purplish ecchymosis, swelling, non-tender.  Assessment and Plan: 1. Contusion of right index finger without damage to nail, initial encounter No blunt trauma so fracture unlikely. Discussed ice, elevation, rest. Can take OTC analgesics if needed. Working restrictions to sedentary for next 2 days. Anticipate complete recovery within a week. FU in 1 week if not improved. RTC precautions discussed.

## 2017-07-25 ENCOUNTER — Encounter: Payer: Self-pay | Admitting: Gastroenterology

## 2017-08-03 ENCOUNTER — Other Ambulatory Visit: Payer: Self-pay | Admitting: Physician Assistant

## 2017-08-03 DIAGNOSIS — Z760 Encounter for issue of repeat prescription: Secondary | ICD-10-CM

## 2017-08-15 ENCOUNTER — Encounter: Payer: Self-pay | Admitting: Gastroenterology

## 2017-08-15 ENCOUNTER — Ambulatory Visit: Payer: BC Managed Care – PPO | Admitting: Gastroenterology

## 2017-08-15 ENCOUNTER — Encounter (INDEPENDENT_AMBULATORY_CARE_PROVIDER_SITE_OTHER): Payer: Self-pay

## 2017-08-15 VITALS — BP 132/80 | HR 94 | Ht 64.5 in | Wt 236.0 lb

## 2017-08-15 DIAGNOSIS — K219 Gastro-esophageal reflux disease without esophagitis: Secondary | ICD-10-CM

## 2017-08-15 DIAGNOSIS — Z1211 Encounter for screening for malignant neoplasm of colon: Secondary | ICD-10-CM | POA: Diagnosis not present

## 2017-08-15 MED ORDER — RANITIDINE HCL 150 MG PO TABS: 150.0000 mg | ORAL_TABLET | Freq: Every day | ORAL | 3 refills | Status: DC | PRN

## 2017-08-15 MED ORDER — SUPREP BOWEL PREP KIT 17.5-3.13-1.6 GM/177ML PO SOLN: ORAL | 0 refills | Status: DC

## 2017-08-15 NOTE — Progress Notes (Signed)
HPI :  63 year old male with a history of GERD, hyperlipidemia, history of cataracts, here for new patient visit to discuss reflux and colon cancer screening.  He was previously seen by Dr. Deatra Ina in January 2015, he is new patient to me.   He's had a history of long-standing reflux. His main symptoms were heartburn and some regurgitation. He has been on Protonix 20 mg once a day for the past 15 years or so. He had an upper endoscopy in 2009 which showed no evidence of Barrett's esophagus. He had some small fundic gland polyps removed. He states Protonix 20 mg once daily controls his symptoms quite well. He denies any significant breakthrough. He denies any dysphagia. He denies any family history of esophageal cancer. He had questions about long-term recommendations for his reflux regimen.  He does endorse some scant rectal bleeding at times, perhaps once a month or so. His is been long-standing, no new changes in his bowels such as diarrhea or constipation. He has no abdominal pains. There is no family history of colon cancer. His last colonoscopy was in March 2009 which showed a small hyperplastic polyp in the transverse colon, the exam was otherwise normal. He has no history of anemia.  He has no history of cardiopulmonary disease, he has tolerated anesthesia in the past.  Colonoscopy 06/19/2007 - transverse colon polyp - c/w hyperplastic polyp, otherwise normal exam EGD 06/19/2007 - normal esophagus, duodenitis and mild gastritis. Multiple gastric polyps noted. Biopsies obtained showing fundic gland polyps, otherwise normal  Past Medical History:  Diagnosis Date  . Colon polyps 2010  . GERD (gastroesophageal reflux disease)   . HLD (hyperlipidemia)      Past Surgical History:  Procedure Laterality Date  . CATARACT EXTRACTION     Family History  Problem Relation Age of Onset  . Diabetes Sister   . Heart disease Father   . Brain cancer Mother        chemical exposure   Social  History   Tobacco Use  . Smoking status: Never Smoker  . Smokeless tobacco: Never Used  Substance Use Topics  . Alcohol use: Yes    Alcohol/week: 0.0 oz    Comment: 1-2 per day  . Drug use: No   Current Outpatient Medications  Medication Sig Dispense Refill  . acetaminophen (TYLENOL) 500 MG chewable tablet Chew 500 mg by mouth every 6 (six) hours as needed for pain.    Marland Kitchen aspirin 81 MG chewable tablet Chew 1 tablet (81 mg total) by mouth daily.    Marland Kitchen HYDROcodone-acetaminophen (NORCO) 5-325 MG tablet Take 1 tablet by mouth every 6 (six) hours as needed (For severe pain only.). For severe pain only. 60 tablet 0  . ibuprofen (ADVIL,MOTRIN) 200 MG tablet Take 200 mg by mouth every 6 (six) hours as needed.    . Multiple Vitamin (MULTIVITAMIN WITH MINERALS) TABS tablet Take 1 tablet by mouth daily.    . pantoprazole (PROTONIX) 20 MG tablet TAKE 1 TABLET TWICE A DAY 180 tablet 2  . rosuvastatin (CRESTOR) 10 MG tablet Take 1 tablet (10 mg total) by mouth daily. 90 tablet 3   No current facility-administered medications for this visit.    Allergies  Allergen Reactions  . Scallops [Shellfish Allergy]     Vomiting      Review of Systems: All systems reviewed and negative except where noted in HPI.   Lab Results  Component Value Date   WBC 7.3 05/27/2017   HGB 14.6 05/27/2017  HCT 43.3 05/27/2017   MCV 90 05/27/2017   PLT 185 05/27/2017    Lab Results  Component Value Date   CREATININE 0.98 06/02/2016   BUN 24 06/02/2016   NA 141 06/02/2016   K 4.3 06/02/2016   CL 101 06/02/2016   CO2 24 06/02/2016    Lab Results  Component Value Date   ALT 31 06/02/2016   AST 22 06/02/2016   ALKPHOS 77 06/02/2016   BILITOT 0.6 06/02/2016     Physical Exam: BP 132/80   Pulse 94   Ht 5' 4.5" (1.638 m)   Wt 236 lb (107 kg)   SpO2 98%   BMI 39.88 kg/m  Constitutional: Pleasant,well-developed, male in no acute distress. HEENT: Normocephalic and atraumatic. Conjunctivae are normal.  No scleral icterus. Neck supple.  Cardiovascular: Normal rate, regular rhythm.  Pulmonary/chest: Effort normal and breath sounds normal. No wheezing, rales or rhonchi. Abdominal: Soft, nondistended, nontender. There are no masses palpable. No hepatomegaly. Extremities: no edema Lymphadenopathy: No cervical adenopathy noted. Neurological: Alert and oriented to person place and time. Skin: Skin is warm and dry. No rashes noted. Psychiatric: Normal mood and affect. Behavior is normal.   ASSESSMENT AND PLAN: 62 year old male with history as outlined above, here for new patient visit to discuss the following issues:  GERD - long-standing symptoms well controlled with low-dose Protonix, he has no alarm symptoms or significant breakthrough. Prior EGD did not show any evidence of Barrett's esophagus. In the absence of any new symptoms/dysphagia or family history of esophageal cancer, I don't feel strongly needs another EGD at this time. We did discuss long-term PPI use and potential associated risks. Long-term, recommend lowest-dose of PPI needed to control symptoms or use of an alternative regimen. He has not tried weaning off Protonix. I would try taking it every other day for a few weeks and see how he does. He can supplement with Zantac if needed. If he does well with this can use Zantac monotherapy and see how this does over time. Alternatively if he notices significant breakthrough as he does this and doesn't tolerate weaning, he can resume Protonix at 20 mg once daily. He agreed with the plan. He will let me know if he has issues doing this.  Colon cancer screening - history of one benign hyperplastic polyp 10 years ago. He is due for colon cancer screening at this time. We discussed optical colonoscopy versus stool based testing. Following discussion of the risks and benefits of optical colonoscopy and anesthesia, he wished to proceed. Further recommendations pending the results of this  exam.  Symsonia Cellar, MD [facility]

## 2017-08-15 NOTE — Patient Instructions (Addendum)
If you are age 64 or older, your body mass index should be between 23-30. Your Body mass index is 39.88 kg/m. If this is out of the aforementioned range listed, please consider follow up with your Primary Care Provider.  If you are age 27 or younger, your body mass index should be between 19-25. Your Body mass index is 39.88 kg/m. If this is out of the aformentioned range listed, please consider follow up with your Primary Care Provider.   You have been scheduled for a colonoscopy. Please follow written instructions given to you at your visit today.  Please pick up your prep supplies at the pharmacy within the next 1-3 days. If you use inhalers (even only as needed), please bring them with you on the day of your procedure. Your physician has requested that you go to www.startemmi.com and enter the access code given to you at your visit today. This web site gives a general overview about your procedure. However, you should still follow specific instructions given to you by our office regarding your preparation for the procedure.  We have sent the following medications to your pharmacy for you to pick up at your convenience: Zantac 150 mg: take one to two tablets daily as needed.  We discussed that you will wean off of Protonix.  Thank you for entrusting me with your care and for choosing [facility], Dr. Cesar Chavez Cellar

## 2017-09-06 ENCOUNTER — Encounter: Payer: Self-pay | Admitting: Gastroenterology

## 2017-09-15 ENCOUNTER — Encounter: Payer: Self-pay | Admitting: Gastroenterology

## 2017-09-15 ENCOUNTER — Ambulatory Visit (AMBULATORY_SURGERY_CENTER): Payer: BC Managed Care – PPO | Admitting: Gastroenterology

## 2017-09-15 ENCOUNTER — Other Ambulatory Visit: Payer: Self-pay

## 2017-09-15 VITALS — BP 123/83 | HR 56 | Temp 97.8°F | Resp 16 | Ht 64.0 in | Wt 236.0 lb

## 2017-09-15 DIAGNOSIS — Z1211 Encounter for screening for malignant neoplasm of colon: Secondary | ICD-10-CM

## 2017-09-15 DIAGNOSIS — D122 Benign neoplasm of ascending colon: Secondary | ICD-10-CM | POA: Diagnosis not present

## 2017-09-15 DIAGNOSIS — D124 Benign neoplasm of descending colon: Secondary | ICD-10-CM

## 2017-09-15 MED ORDER — SODIUM CHLORIDE 0.9 % IV SOLN: 500.0000 mL | Freq: Once | INTRAVENOUS | Status: DC

## 2017-09-15 NOTE — Op Note (Signed)
[facility] Patient Name: Drew [name] Procedure Date: 09/15/2017 4:04 PM MRN: [id number] Endoscopist: Remo Lipps P. Havery Moros , MD Age: 62 Referring MD:  Date of Birth: 09-16-1955 Gender: Male Account #: [id number] Procedure:                Colonoscopy Indications:              Screening for colorectal malignant neoplasm Medicines:                Monitored Anesthesia Care Procedure:                Pre-Anesthesia Assessment:                           - Prior to the procedure, a History and Physical                            was performed, and patient medications and                            allergies were reviewed. The patient's tolerance of                            previous anesthesia was also reviewed. The risks                            and benefits of the procedure and the sedation                            options and risks were discussed with the patient.                            All questions were answered, and informed consent                            was obtained. Prior Anticoagulants: The patient has                            taken no previous anticoagulant or antiplatelet                            agents. ASA Grade Assessment: II - A patient with                            mild systemic disease. After reviewing the risks                            and benefits, the patient was deemed in                            satisfactory condition to undergo the procedure.                           After obtaining informed consent, the colonoscope  was passed under direct vision. Throughout the                            procedure, the patient's blood pressure, pulse, and                            oxygen saturations were monitored continuously. The                            Colonoscope was introduced through the anus and                            advanced to the the cecum, identified by                            appendiceal orifice and  ileocecal valve. The                            colonoscopy was performed without difficulty. The                            patient tolerated the procedure well. The quality                            of the bowel preparation was good. The ileocecal                            valve, appendiceal orifice, and rectum were                            photographed. Scope In: 4:08:00 PM Scope Out: 4:35:45 PM Scope Withdrawal Time: 0 hours 24 minutes 1 second  Total Procedure Duration: 0 hours 27 minutes 45 seconds  Findings:                 The perianal and digital rectal examinations were                            normal.                           A 8 mm polyp was found in the ascending colon. The                            polyp was flat. The polyp was removed with a cold                            snare. Resection and retrieval were complete.                           A 4 mm polyp was found in the descending colon. The                            polyp was sessile. The polyp was removed with a  cold snare. Resection and retrieval were complete.                           Multiple medium-mouthed diverticula were found in                            the sigmoid colon.                           Internal hemorrhoids were found during retroflexion.                           The exam was otherwise without abnormality. Complications:            No immediate complications. Estimated blood loss:                            Minimal. Estimated Blood Loss:     Estimated blood loss was minimal. Impression:               - One 8 mm polyp in the ascending colon, removed                            with a cold snare. Resected and retrieved.                           - One 4 mm polyp in the descending colon, removed                            with a cold snare. Resected and retrieved.                           - Diverticulosis in the sigmoid colon.                           - Internal  hemorrhoids.                           - The examination was otherwise normal. Recommendation:           - Patient has a contact number available for                            emergencies. The signs and symptoms of potential                            delayed complications were discussed with the                            patient. Return to normal activities tomorrow.                            Written discharge instructions were provided to the                            patient.                           -  Resume previous diet.                           - Continue present medications.                           - Await pathology results.                           - Repeat colonoscopy for surveillance based on                            pathology results. Remo Lipps P. Armbruster, MD 09/15/2017 4:40:12 PM This report has been signed electronically.

## 2017-09-15 NOTE — Progress Notes (Signed)
Called to room to assist during endoscopic procedure.  Patient ID and intended procedure confirmed with present staff. Received instructions for my participation in the procedure from the performing physician.  

## 2017-09-15 NOTE — Progress Notes (Signed)
To PACU, VSS. Report to [email or web address] 

## 2017-09-15 NOTE — Patient Instructions (Signed)
  Low fiber for first meal.  Handouts given on polyps (2), diverticulosis and hemorrhoids.    YOU HAD AN ENDOSCOPIC PROCEDURE TODAY AT [facility]:   Refer to the procedure report that was given to you for any specific questions about what was found during the examination.  If the procedure report does not answer your questions, please call your gastroenterologist to clarify.  If you requested that your care partner not be given the details of your procedure findings, then the procedure report has been included in a sealed envelope for you to review at your convenience later.  YOU SHOULD EXPECT: Some feelings of bloating in the abdomen. Passage of more gas than usual.  Walking can help get rid of the air that was put into your GI tract during the procedure and reduce the bloating. If you had a lower endoscopy (such as a colonoscopy or flexible sigmoidoscopy) you may notice spotting of blood in your stool or on the toilet paper. If you underwent a bowel prep for your procedure, you may not have a normal bowel movement for a few days.  Please Note:  You might notice some irritation and congestion in your nose or some drainage.  This is from the oxygen used during your procedure.  There is no need for concern and it should clear up in a day or so.  SYMPTOMS TO REPORT IMMEDIATELY:   Following lower endoscopy (colonoscopy or flexible sigmoidoscopy):  Excessive amounts of blood in the stool  Significant tenderness or worsening of abdominal pains  Swelling of the abdomen that is new, acute  Fever of 100F or higher   For urgent or emergent issues, a gastroenterologist can be reached at any hour by calling [phone].   DIET:  We do recommend a small meal at first, but then you may proceed to your regular diet.  Drink plenty of fluids but you should avoid alcoholic beverages for 24 hours.  ACTIVITY:  You should plan to take it easy for the rest of today and you should NOT  DRIVE or use heavy machinery until tomorrow (because of the sedation medicines used during the test).    FOLLOW UP: Our staff will call the number listed on your records the next business day following your procedure to check on you and address any questions or concerns that you may have regarding the information given to you following your procedure. If we do not reach you, we will leave a message.  However, if you are feeling well and you are not experiencing any problems, there is no need to return our call.  We will assume that you have returned to your regular daily activities without incident.  If any biopsies were taken you will be contacted by phone or by letter within the next 1-3 weeks.  Please call us at [phone] if you have not heard about the biopsies in 3 weeks.    SIGNATURES/CONFIDENTIALITY: You and/or your care partner have signed paperwork which will be entered into your electronic medical record.  These signatures attest to the fact that that the information above on your After Visit Summary has been reviewed and is understood.  Full responsibility of the confidentiality of this discharge information lies with you and/or your care-partner.

## 2017-09-18 ENCOUNTER — Telehealth: Payer: Self-pay | Admitting: *Deleted

## 2017-09-18 ENCOUNTER — Telehealth: Payer: Self-pay

## 2017-09-18 NOTE — Telephone Encounter (Signed)
Pt called back states he is doing good.

## 2017-09-18 NOTE — Telephone Encounter (Signed)
No answer for post procedure call back. Left message and will attempt to call back later this afternoon. SM 

## 2017-09-18 NOTE — Telephone Encounter (Signed)
Left message

## 2017-09-21 ENCOUNTER — Encounter: Payer: Self-pay | Admitting: Gastroenterology

## 2017-10-03 ENCOUNTER — Encounter (INDEPENDENT_AMBULATORY_CARE_PROVIDER_SITE_OTHER): Payer: BC Managed Care – PPO | Admitting: Ophthalmology

## 2017-10-03 DIAGNOSIS — H43813 Vitreous degeneration, bilateral: Secondary | ICD-10-CM | POA: Diagnosis not present

## 2017-10-03 DIAGNOSIS — H33021 Retinal detachment with multiple breaks, right eye: Secondary | ICD-10-CM

## 2017-10-03 DIAGNOSIS — H35411 Lattice degeneration of retina, right eye: Secondary | ICD-10-CM

## 2017-10-09 ENCOUNTER — Encounter (INDEPENDENT_AMBULATORY_CARE_PROVIDER_SITE_OTHER): Payer: BC Managed Care – PPO | Admitting: Ophthalmology

## 2017-10-09 DIAGNOSIS — H4311 Vitreous hemorrhage, right eye: Secondary | ICD-10-CM | POA: Diagnosis not present

## 2017-10-11 ENCOUNTER — Telehealth: Payer: Self-pay | Admitting: Physician Assistant

## 2017-10-11 ENCOUNTER — Encounter (INDEPENDENT_AMBULATORY_CARE_PROVIDER_SITE_OTHER): Payer: Self-pay | Admitting: Ophthalmology

## 2017-10-11 NOTE — Telephone Encounter (Signed)
Copied from [place] [phone]. Topic: Quick Communication - See Telephone Encounter >> Oct 11, 2017  4:11 PM Mylinda Latina, NT wrote: CRM for notification. See Telephone encounter for: 10/11/17. Patient called and states that he had laser surgery to removed a tore cataract . Patient states Dr. Zigmund Daniel told him that he needed to stop the baby Asprin. He is needing approval from Philis Fendt to stop taking the Asprin. Please call patient. Please leave detailed message if patient doesn't answer CB# [phone]

## 2017-10-12 NOTE — Telephone Encounter (Signed)
Discussed with Bess Harvest - he agreed pt should stop ASA.  Dr. Zigmund Daniel is the specialist and he should advise pt when it is safe to restart the ASA.  Called pt LMOVM with above message.

## 2017-10-13 ENCOUNTER — Encounter (INDEPENDENT_AMBULATORY_CARE_PROVIDER_SITE_OTHER): Payer: BC Managed Care – PPO | Admitting: Ophthalmology

## 2017-10-13 DIAGNOSIS — H4311 Vitreous hemorrhage, right eye: Secondary | ICD-10-CM | POA: Diagnosis not present

## 2017-10-19 ENCOUNTER — Encounter (INDEPENDENT_AMBULATORY_CARE_PROVIDER_SITE_OTHER): Payer: BC Managed Care – PPO | Admitting: Ophthalmology

## 2017-10-19 DIAGNOSIS — H4311 Vitreous hemorrhage, right eye: Secondary | ICD-10-CM

## 2017-10-19 DIAGNOSIS — H33301 Unspecified retinal break, right eye: Secondary | ICD-10-CM

## 2017-11-02 ENCOUNTER — Encounter (INDEPENDENT_AMBULATORY_CARE_PROVIDER_SITE_OTHER): Payer: BC Managed Care – PPO | Admitting: Ophthalmology

## 2017-11-02 DIAGNOSIS — H33301 Unspecified retinal break, right eye: Secondary | ICD-10-CM

## 2017-11-08 ENCOUNTER — Telehealth: Payer: Self-pay | Admitting: Physician Assistant

## 2017-11-09 ENCOUNTER — Ambulatory Visit: Payer: BC Managed Care – PPO | Admitting: Physician Assistant

## 2017-11-09 ENCOUNTER — Telehealth: Payer: Self-pay | Admitting: Physician Assistant

## 2017-11-09 VITALS — BP 143/87 | HR 64 | Temp 98.2°F | Resp 16 | Ht 64.0 in | Wt 237.0 lb

## 2017-11-09 DIAGNOSIS — R7989 Other specified abnormal findings of blood chemistry: Secondary | ICD-10-CM | POA: Diagnosis not present

## 2017-11-09 DIAGNOSIS — K219 Gastro-esophageal reflux disease without esophagitis: Secondary | ICD-10-CM

## 2017-11-09 DIAGNOSIS — M47819 Spondylosis without myelopathy or radiculopathy, site unspecified: Secondary | ICD-10-CM

## 2017-11-09 DIAGNOSIS — E785 Hyperlipidemia, unspecified: Secondary | ICD-10-CM

## 2017-11-09 MED ORDER — HYDROCODONE-ACETAMINOPHEN 5-325 MG PO TABS: 1.0000 | ORAL_TABLET | Freq: Every day | ORAL | 0 refills | Status: DC | PRN

## 2017-11-09 MED ORDER — ROSUVASTATIN CALCIUM 10 MG PO TABS: 10.0000 mg | ORAL_TABLET | Freq: Every day | ORAL | 3 refills | Status: DC

## 2017-11-09 NOTE — Telephone Encounter (Signed)
Copied from [place] [id number]. Topic: General - Other >> Nov 09, 2017  2:44 PM Lennox Solders wrote: Reason for CRM:pt is calling and needs PA on hydrocodone for quantity. [place] dept [phone]

## 2017-11-09 NOTE — Progress Notes (Signed)
11/09/2017 1:01 PM   DOB: [birth year] / MRN: [id number]  SUBJECTIVE:  Drew [name] is a 62 y.o. male presenting for recheck chronic conditions.  GERD: PPI was stopped by Armbruster and pt started on Zantac 150 BID and is doing well with this.   Chronic back pain with radiographic DDD.  Pain maintained on mostly tylenol and ibuprofen.  Take norco sparingly.    Dyslipidemia with increased risk of ASCVD: Statin compliant.  Patient's ASA was stopped by optho secondary to retinal repair with excessive bleeding.   Patient plans to find a new provider in the area.   He is allergic to scallops [shellfish allergy].   He  has a past medical history of Colon polyps (2010), GERD (gastroesophageal reflux disease), and HLD (hyperlipidemia).    He  reports that he has never smoked. He has never used smokeless tobacco. He reports that he drinks alcohol. He reports that he does not use drugs. He  has no sexual activity history on file. The patient  has a past surgical history that includes Cataract extraction.  His family history includes Brain cancer in his mother; Diabetes in his sister; Heart disease in his father.  Review of Systems  Constitutional: Negative for chills, diaphoresis and fever.  Eyes: Negative.   Respiratory: Negative for cough, hemoptysis, sputum production, shortness of breath and wheezing.   Cardiovascular: Negative for chest pain, orthopnea and leg swelling.  Gastrointestinal: Negative for abdominal pain, blood in stool, constipation, diarrhea, heartburn, melena, nausea and vomiting.  Genitourinary: Negative for dysuria, flank pain, frequency, hematuria and urgency.  Musculoskeletal: Positive for back pain and myalgias. Negative for falls, joint pain and neck pain.  Skin: Negative for rash.  Neurological: Negative for dizziness, sensory change, speech change, focal weakness and headaches.    The problem list and medications were reviewed and updated by myself where necessary  and exist elsewhere in the encounter.   OBJECTIVE:  BP (!) 143/87   Pulse 64   Temp 98.2 F (36.8 C) (Oral)   Resp 16   Ht 5\' 4"  (1.626 m)   Wt 237 lb (107.5 kg)   SpO2 97%   BMI 40.68 kg/m   Wt Readings from Last 3 Encounters:  11/09/17 237 lb (107.5 kg)  09/15/17 236 lb (107 kg)  08/15/17 236 lb (107 kg)   Temp Readings from Last 3 Encounters:  11/09/17 98.2 F (36.8 C) (Oral)  09/15/17 97.8 F (36.6 C)  06/27/17 97.8 F (36.6 C) (Oral)   BP Readings from Last 3 Encounters:  11/09/17 (!) 143/87  09/15/17 123/83  08/15/17 132/80   Pulse Readings from Last 3 Encounters:  11/09/17 64  09/15/17 (!) 56  08/15/17 94    Physical Exam  Constitutional: He is oriented to person, place, and time. He appears well-developed. He does not appear ill.  Eyes: Pupils are equal, round, and reactive to light. Conjunctivae and EOM are normal.  Cardiovascular: Normal rate, regular rhythm, S1 normal, S2 normal, normal heart sounds, intact distal pulses and normal pulses. Exam reveals no gallop and no friction rub.  No murmur heard. Pulmonary/Chest: Effort normal. No stridor. No respiratory distress. He has no wheezes. He has no rales.  Abdominal: He exhibits no distension.  Musculoskeletal: Normal range of motion. He exhibits no edema.  Neurological: He is alert and oriented to person, place, and time. No cranial nerve deficit. Coordination normal.  Skin: Skin is warm and dry. He is not diaphoretic.  Psychiatric: He has a normal  mood and affect.  Nursing note and vitals reviewed.   Lab Results  Component Value Date   HGBA1C 5.9 (H) 05/27/2017    Lab Results  Component Value Date   WBC 7.3 05/27/2017   HGB 14.6 05/27/2017   HCT 43.3 05/27/2017   MCV 90 05/27/2017   PLT 185 05/27/2017    Lab Results  Component Value Date   CREATININE 0.98 06/02/2016   BUN 24 06/02/2016   NA 141 06/02/2016   K 4.3 06/02/2016   CL 101 06/02/2016   CO2 24 06/02/2016    Lab Results    Component Value Date   ALT 31 06/02/2016   AST 22 06/02/2016   ALKPHOS 77 06/02/2016   BILITOT 0.6 06/02/2016    Lab Results  Component Value Date   TSH 4.740 (H) 05/27/2017    Lab Results  Component Value Date   CHOL 170 05/27/2017   HDL 40 05/27/2017   LDLCALC 69 05/27/2017   TRIG 305 (H) 05/27/2017   CHOLHDL 4.3 05/27/2017   The 10-year ASCVD risk score Mikey Bussing DC Jr., et al., 2013) is: 14.4%   Values used to calculate the score:     Age: 20 years     Sex: Male     Is Non-Hispanic African American: No     Diabetic: No     Tobacco smoker: No     Systolic Blood Pressure: 025 mmHg     Is BP treated: No     HDL Cholesterol: 40 mg/dL     Total Cholesterol: 170 mg/dL   ASSESSMENT AND PLAN:  Joe was seen today for follow-up.  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 Uses narcotic sparingly.  Orders: -     HYDROcodone-acetaminophen (NORCO) 5-325 MG tablet; Take 1 tablet by mouth daily as needed for severe pain (For severe pain only.). For severe pain only.  Gastroesophageal reflux disease, esophagitis presence not specified Comments: Managed by GI.   Dyslipidemia Comments: I would like him on ASA given ASCVD of 14 however previous eye surgery precludes this. Will defer to optho or next PCP.  Orders: -     rosuvastatin (CRESTOR) 10 MG tablet; Take 1 tablet (10 mg total) by mouth daily. -     Renal Function Panel  Elevated TSH Comments: Previously with low normal T4.  Recheck.  Orders: -     TSH -     T4, Free    The patient is advised to call or return to clinic if he does not see an improvement in symptoms, or to seek the care of the closest emergency department if he worsens with the above plan.   Philis Fendt, MHS, PA-C [facility] 11/09/2017 1:01 PM

## 2017-11-09 NOTE — Patient Instructions (Signed)
     I will contact you with your lab results within the next 2 weeks.  If you have not heard from us then please contact us. The fastest way to get your results is to register for My Chart.   IF you received an x-ray today, you will receive an invoice from [facility]. Please contact [facility] at [phone] with questions or concerns regarding your invoice.   IF you received labwork today, you will receive an invoice from [facility]. Please contact [facility] at [phone] with questions or concerns regarding your invoice.   Our billing staff will not be able to assist you with questions regarding bills from these companies.  You will be contacted with the lab results as soon as they are available. The fastest way to get your results is to activate your My Chart account. Instructions are located on the last page of this paperwork. If you have not heard from us regarding the results in 2 weeks, please contact this office.     

## 2017-11-10 DIAGNOSIS — M47819 Spondylosis without myelopathy or radiculopathy, site unspecified: Secondary | ICD-10-CM | POA: Insufficient documentation

## 2017-11-10 LAB — TSH: TSH: 3.43 u[IU]/mL (ref 0.450–4.500)

## 2017-11-10 LAB — RENAL FUNCTION PANEL
ALBUMIN: 4.2 g/dL (ref 3.6–4.8)
BUN/Creatinine Ratio: 15 (ref 10–24)
BUN: 14 mg/dL (ref 8–27)
CHLORIDE: 105 mmol/L (ref 96–106)
CO2: 25 mmol/L (ref 20–29)
CREATININE: 0.93 mg/dL (ref 0.76–1.27)
Calcium: 9.6 mg/dL (ref 8.6–10.2)
GFR, EST AFRICAN AMERICAN: 100 mL/min/{1.73_m2} (ref >59)
GFR, EST NON AFRICAN AMERICAN: 86 mL/min/{1.73_m2} (ref >59)
Glucose: 112 mg/dL — ABNORMAL HIGH (ref 65–99)
Phosphorus: 2.9 mg/dL (ref 2.5–4.5)
Potassium: 4.6 mmol/L (ref 3.5–5.2)
Sodium: 143 mmol/L (ref 134–144)

## 2017-11-10 LAB — T4, FREE: Free T4: 1.1 ng/dL (ref 0.82–1.77)

## 2017-11-10 NOTE — Telephone Encounter (Signed)
PA started with [place] my Meds for Norco/Hydrocodone 5-325mg  tablets #60 - 2 month supply Key [id number]  Should respond within 24 hours.

## 2017-11-14 NOTE — Telephone Encounter (Signed)
RX was denied for Hydrocodone-Acetaminophen 5-325 MG OR Tabs  Key: [name] 11/14/17  Forms in [facility] box

## 2017-11-15 ENCOUNTER — Encounter: Payer: Self-pay | Admitting: *Deleted

## 2017-11-20 ENCOUNTER — Telehealth: Payer: Self-pay | Admitting: Physician Assistant

## 2017-11-20 NOTE — Telephone Encounter (Signed)
Left a voicemail to let the patient know his medication (Hydrocodone) was approved by his insurance company.

## 2017-11-29 ENCOUNTER — Telehealth: Payer: Self-pay | Admitting: Physician Assistant

## 2017-11-29 NOTE — Telephone Encounter (Signed)
Left a voicemail to let the patient know his prescription for Hydrocodone was approved by his insurance company.

## 2017-11-30 NOTE — Telephone Encounter (Signed)
Pt states that he tried to pick up prescription of this medication but was told by the pharmcy that it was not approved by his insurance company 9/5. Please advise.

## 2017-12-01 ENCOUNTER — Encounter (INDEPENDENT_AMBULATORY_CARE_PROVIDER_SITE_OTHER): Payer: BC Managed Care – PPO | Admitting: Ophthalmology

## 2017-12-01 DIAGNOSIS — H33301 Unspecified retinal break, right eye: Secondary | ICD-10-CM

## 2017-12-01 DIAGNOSIS — H4311 Vitreous hemorrhage, right eye: Secondary | ICD-10-CM

## 2017-12-07 ENCOUNTER — Encounter (INDEPENDENT_AMBULATORY_CARE_PROVIDER_SITE_OTHER): Payer: BC Managed Care – PPO | Admitting: Ophthalmology

## 2017-12-07 DIAGNOSIS — H33301 Unspecified retinal break, right eye: Secondary | ICD-10-CM

## 2017-12-08 NOTE — Telephone Encounter (Signed)
Checked CoverMyMeds and the RX was approved. Called [facility] on [address] at [phone] and confirmed with them. Called pt and left VM letting him know to go to the pharmacy to pick up meds.

## 2018-02-06 ENCOUNTER — Encounter (INDEPENDENT_AMBULATORY_CARE_PROVIDER_SITE_OTHER): Payer: BC Managed Care – PPO | Admitting: Ophthalmology

## 2018-02-06 DIAGNOSIS — H33301 Unspecified retinal break, right eye: Secondary | ICD-10-CM

## 2018-02-06 DIAGNOSIS — H43813 Vitreous degeneration, bilateral: Secondary | ICD-10-CM

## 2018-02-06 DIAGNOSIS — H4311 Vitreous hemorrhage, right eye: Secondary | ICD-10-CM | POA: Diagnosis not present

## 2018-03-05 ENCOUNTER — Other Ambulatory Visit: Payer: Self-pay

## 2018-03-05 ENCOUNTER — Ambulatory Visit: Payer: Self-pay | Admitting: *Deleted

## 2018-03-05 MED ORDER — FAMOTIDINE 20 MG PO TABS: 20.0000 mg | ORAL_TABLET | Freq: Every day | ORAL | 1 refills | Status: DC | PRN

## 2018-03-05 NOTE — Telephone Encounter (Signed)
Pt reports chest pain x 3-4 months "After starting Crestor."  States only with exertion "After walking a block."  Also reports SOB with exertion, onset  3-4 months ago. Both CP and SOB resolve with rest.  States pain at entire chest, also reports "Gets better with deep breathing."  Pain does not radiate, denies any dizziness, nausea, no diaphoresis, has not worsened progressively. TN spoke with practice regarding appt; pt directed to UC.  Pt verbalizes understanding.  Reason for Disposition . [1] Chest pain(s) lasting a few seconds AND [2] persists > 3 days  Answer Assessment - Initial Assessment Questions 1. LOCATION: "Where does it hurt?"       Entire chest 2. RADIATION: "Does the pain go anywhere else?" (e.g., into neck, jaw, arms, back)     no 3. ONSET: "When did the chest pain begin?" (Minutes, hours or days)      3-4 months ago. With exertion only, relief with deep breathing 4. PATTERN "Does the pain come and go, or has it been constant since it started?"  "Does it get worse with exertion?"      Intermittent  5. DURATION: "How long does it last" (e.g., seconds, minutes, hours)    Resolves with rest 6. SEVERITY: "How bad is the pain?"  (e.g., Scale 1-10; mild, moderate, or severe)    - MILD (1-3): doesn't interfere with normal activities     - MODERATE (4-7): interferes with normal activities or awakens from sleep    - SEVERE (8-10): excruciating pain, unable to do any normal activities       Varies 7. CARDIAC RISK FACTORS: "Do you have any history of heart problems or risk factors for heart disease?" (e.g., prior heart attack, angina; high blood pressure, diabetes, being overweight, high cholesterol, smoking, or strong family history of heart disease)     overweight 8. PULMONARY RISK FACTORS: "Do you have any history of lung disease?"  (e.g., blood clots in lung, asthma, emphysema, birth control pills)     no 9. CAUSE: "What do you think is causing the chest pain?"     Crestor 10.  OTHER SYMPTOMS: "Do you have any other symptoms?" (e.g., dizziness, nausea, vomiting, sweating, fever, difficulty breathing, cough)       SOB with exertion only x 3-4 months.  Protocols used: CHEST PAIN-A-AH

## 2018-03-06 ENCOUNTER — Ambulatory Visit (INDEPENDENT_AMBULATORY_CARE_PROVIDER_SITE_OTHER): Payer: BC Managed Care – PPO

## 2018-03-06 ENCOUNTER — Ambulatory Visit (HOSPITAL_COMMUNITY): Payer: BC Managed Care – PPO

## 2018-03-06 ENCOUNTER — Ambulatory Visit (HOSPITAL_COMMUNITY)
Admission: EM | Admit: 2018-03-06 | Discharge: 2018-03-06 | Disposition: A | Discharge status: Home / Self Care | Payer: BC Managed Care – PPO | Attending: Physician Assistant | Admitting: Physician Assistant

## 2018-03-06 ENCOUNTER — Encounter (HOSPITAL_COMMUNITY): Payer: Self-pay

## 2018-03-06 DIAGNOSIS — I208 Other forms of angina pectoris: Secondary | ICD-10-CM | POA: Diagnosis not present

## 2018-03-06 LAB — POCT I-STAT, CHEM 8
BUN: 16 mg/dL (ref 8–23)
Calcium, Ion: 1.18 mmol/L (ref 1.15–1.40)
Chloride: 103 mmol/L (ref 98–111)
Creatinine, Ser: 1 mg/dL (ref 0.61–1.24)
Glucose, Bld: 100 mg/dL — ABNORMAL HIGH (ref 70–99)
HCT: 44 % (ref 39.0–52.0)
Hemoglobin: 15 g/dL (ref 13.0–17.0)
Potassium: 4.2 mmol/L (ref 3.5–5.1)
Sodium: 138 mmol/L (ref 135–145)
TCO2: 29 mmol/L (ref 22–32)

## 2018-03-06 NOTE — ED Triage Notes (Signed)
Pt presents with shortness of breath and chest tightness/pain upon exertion.

## 2018-03-06 NOTE — ED Provider Notes (Signed)
03/06/2018 2:38 PM   DOB: 1955/04/23 / MRN: [id number]  SUBJECTIVE:  Drew [name] is a 62 y.o. male presenting for new onset SOB and DOE along with chest pressure that started about three months ago and has been progressively worsening.  Can walk about 1 to 2 blocks only without needing to rest. Recently stopped ASA at the request of his ophthalmologist. Is compliant with Crestor therapy daily.  No history of HTN and thus has never needed medication. He has gained about 10 lbs in the last three months.   He is allergic to scallops [shellfish allergy].   He  has a past medical history of Colon polyps (2010), GERD (gastroesophageal reflux disease), and HLD (hyperlipidemia).    He  reports that he has never smoked. He has never used smokeless tobacco. He reports that he drinks alcohol. He reports that he does not use drugs. He  has no sexual activity history on file. The patient  has a past surgical history that includes Cataract extraction.  His family history includes Brain cancer in his mother; Diabetes in his sister; Heart disease in his father.  Review of Systems  Constitutional: Negative for diaphoresis.  Eyes: Negative.   Respiratory: Positive for shortness of breath. Negative for cough, hemoptysis, sputum production and wheezing.   Cardiovascular: Positive for chest pain. Negative for orthopnea and leg swelling.  Gastrointestinal: Negative for nausea.  Neurological: Negative for dizziness, sensory change, speech change, focal weakness and headaches.    OBJECTIVE:  BP (!) 173/93 (BP Location: Right Arm)   Pulse 61   Temp 98.2 F (36.8 C) (Oral)   Resp 18   SpO2 94%   Wt Readings from Last 3 Encounters:  11/09/17 237 lb (107.5 kg)  09/15/17 236 lb (107 kg)  08/15/17 236 lb (107 kg)   Temp Readings from Last 3 Encounters:  03/06/18 98.2 F (36.8 C) (Oral)  11/09/17 98.2 F (36.8 C) (Oral)  09/15/17 97.8 F (36.6 C)   BP Readings from Last 3 Encounters:  03/06/18 (!)  173/93  11/09/17 (!) 143/87  09/15/17 123/83   Pulse Readings from Last 3 Encounters:  03/06/18 61  11/09/17 64  09/15/17 (!) 56   The 10-year ASCVD risk score Mikey Bussing DC Jr., et al., 2013) is: 19.5%   Values used to calculate the score:     Age: 88 years     Sex: Male     Is Non-Hispanic African American: No     Diabetic: No     Tobacco smoker: No     Systolic Blood Pressure: 387 mmHg     Is BP treated: No     HDL Cholesterol: 40 mg/dL     Total Cholesterol: 170 mg/dL   Physical Exam  Constitutional: He is oriented to person, place, and time. He appears well-developed. He does not appear ill.  Eyes: Pupils are equal, round, and reactive to light. Conjunctivae and EOM are normal.  Cardiovascular: Normal rate.  Pulmonary/Chest: Effort normal.  Abdominal: He exhibits no distension.  Musculoskeletal: Normal range of motion.  Neurological: He is alert and oriented to person, place, and time. No cranial nerve deficit. Coordination normal.  Skin: Skin is warm and dry. He is not diaphoretic.  Psychiatric: He has a normal mood and affect.  Nursing note and vitals reviewed.   No results found for this or any previous visit (from the past 72 hour(s)).  Dg Chest 2 View  Result Date: 03/06/2018 CLINICAL DATA:  Chest pain and shortness of  breath with exertion, some wheezing over the last 3 months EXAM: CHEST - 2 VIEW COMPARISON:  None. FINDINGS: No active infiltrate or effusion is seen. Mediastinal and hilar contours are unremarkable. The heart is within normal limits in size. No acute bony abnormality is seen. IMPRESSION: No active cardiopulmonary disease. Electronically Signed   By: Ivar Drape M.D.   On: 03/06/2018 14:32   Wt Readings from Last 3 Encounters:  11/09/17 237 lb (107.5 kg)  09/15/17 236 lb (107 kg)  08/15/17 236 lb (107 kg)   Temp Readings from Last 3 Encounters:  03/06/18 98.2 F (36.8 C) (Oral)  11/09/17 98.2 F (36.8 C) (Oral)  09/15/17 97.8 F (36.6 C)   BP  Readings from Last 3 Encounters:  03/06/18 (!) 173/93  11/09/17 (!) 143/87  09/15/17 123/83   Pulse Readings from Last 3 Encounters:  03/06/18 61  11/09/17 64  09/15/17 (!) 56    ASSESSMENT AND PLAN:   Stable angina ([place]): Patient dispoed to cardiology (Dr. Nadyne Coombes) who has graciously agreed to see the patient today.     Discharge Instructions     Go to Dr. Mila Palmer office today so that he may evaluate you.  You need to have a stress test and I hope he can do that today for you.          The patient is advised to call or return to clinic if he does not see an improvement in symptoms, or to seek the care of the closest emergency department if he worsens with the above plan.   Philis Fendt, MHS, PA-C 03/06/2018 2:38 PM   Tereasa Coop, PA-C 03/06/18 1440

## 2018-03-06 NOTE — Discharge Instructions (Signed)
Go to Dr. Mila Palmer office today so that he may evaluate you.  You need to have a stress test and I hope he can do that today for you.

## 2018-03-30 NOTE — Progress Notes (Signed)
Nuclear Cardiology report  Intermediate to hight risk submaximal stress test 03/23/18

## 2018-04-05 DIAGNOSIS — I209 Angina pectoris, unspecified: Secondary | ICD-10-CM | POA: Diagnosis present

## 2018-04-05 NOTE — H&P (Signed)
OFFICE VISIT NOTES COPIED TO EPIC FOR DOCUMENTATION  History of Present Illness Gwinda Maine FNP-C; 2018-03-31 3:36 PM) Patient words: fu after Testing gxt, echo; last OV 03/06/18.  The patient is a 63 year old male who presents for a follow-up for Chest pain. Patient with hyperlipidemia, prediabetes, recently evaluated by [place] for worsening chest pain and shortness of breath.  Patient originally noticed symptoms 3-4 months ago of exertional chest pain and shortness of breath that have resolved with resting. Symptoms were concerning for angina, underwent echocardiogram and exercise nuclear stress test and now presents to discuss results..  He has continued to have symptoms despite being started on amlodipine and metoprolol tartrate at his last office visit. He has not used any nitroglycerin as his symptoms have resolved with resting. No new complaints today.  No former tobacco use. He did previously exercise on a stationary bicycle several times throughout the week, but for the last few weeks has not been doing so in view of his symptoms.  Does mention that his left foot does get cold at night and occasionally has leg cramping. Denies any claudication symptoms.   Problem List/Past Medical Georgeanna Harrison; 03-31-18 2:12 PM) GERD (gastroesophageal reflux disease) (K21.9)  Colon polyp (K63.5)  Cataract (H26.9)  Laboratory examination (Z01.89)  05/27/2017: CBC normal. Cholesterol 170, triglycerides 305, HDL 40, LDL 69. TSH mildly elevated at 4.74. Hemoglobin A1c 5.9%. Angina pectoris (I20.9)  EKG 03/06/2018: Normal sinus rhythm at 62 bpm, normal axis, no evidence of ischemia. Exercise sestamibi stress test 03/23/2018: 1. The patient performed treadmill exercise using Bruce protocol, completing 6:30 minutes. The patient completed an estimated workload of 7 METS, reaching 82% of the maximum predicted heart rate. Normal hemodynamic response was seen. Stress symptoms included chest pressure and  dizziness. Exercise capacity was low. This is submaximal stress test. 2. The overall quality of the study is good. Left ventricular cavity is noted to be normal on the rest and stress studies. Gated SPECT images reveal mild basal inferior/ inferoseptal dyskinesis. LVEF 43%. SPECT images demonstrate small size, predominantly reversible, moderate intensity perfusion defect in basal inferior/ inferoseptal myocardium. 3. Intermediate to high risk submaximal stress test. Prediabetes (R73.03)  Mixed hyperlipidemia (E78.2)  Bilateral leg edema (R60.0)  Echocardiogram 03/12/2018: 1. Left ventricle cavity is normal in size. Mild concentric hypertrophy of the left ventricle. Normal global wall motion. Normal diastolic filling pattern, normal LAP. Calculated EF 55%. 2. Trace mitral regurgitation. Trace tricuspid regurgitation. Trace pulmonic regurgitation. Obesity, morbid, BMI 40.0-49.9 (E66.01)   Allergies Georgeanna Harrison; Mar 31, 2018 2:12 PM) Iodine *CHEMICALS*  shell fish allergy  Family History Georgeanna Harrison; 2018/03/31 2:12 PM) Mother  Deceased. died at 39 brain cancer Father  Deceased. numerous heart attack in his 42's heavy smoker, Sister 1  In stable health. older and DM Brother 2  both younger, stable health  Social History Georgeanna Harrison; 03-31-2018 2:12 PM) Current tobacco use  Never smoker. Alcohol Use  Moderate alcohol use. Marital status  Married. Living Situation  Lives with spouse. Number of Children  2.  Past Surgical History Georgeanna Harrison; 03-31-2018 2:12 PM) Cataract Extraction-Bilateral [2018]: Detached Retina Repair [2019]:  Medication History Georgeanna Harrison; Mar 31, 2018 2:20 PM) Metoprolol Tartrate (25MG  Tablet, 1 (one) Tablet Oral twice a day, Taken starting 03/06/2018) Active. amLODIPine Besylate (5MG  Tablet, 1 (one) Tablet Oral daily, Taken starting 03/06/2018) Active. Nitroglycerin (0.4MG  Tab Sublingual, 1 (one) Tablet Sublingual every 5 minutes as needed for  chest pain., Taken starting 03/06/2018) Active. Aspirin Adult Low Dose (81MG  Tablet  DR, 1 (one) Tablet Oral daily, Taken starting 03/06/2018) Active. Rosuvastatin Calcium (20MG  Tablet, 10 mg Tablet Oral daily, Taken starting 03/06/2018) Active. (increased dose) Acetaminophen (500MG  Tablet, 500 mg Oral as needed) Active. raNITIdine 150 Max Strength (150MG  Tablet, Oral daily) Active. HYDROcodone-Acetaminophen (5-325MG  Tablet, 1 Oral as needed) Active. Medications Reconciled (verbally)  Diagnostic Studies History Georgeanna Harrison; 05-Apr-2018 2:14 PM) Colonoscopy [2019]: polyps removed spinal injections  Nuclear stress test  Exercise sestamibi stress test 03/23/2018: 1. The patient performed treadmill exercise using Bruce protocol, completing 6:30 minutes. The patient completed an estimated workload of 7 METS, reaching 82% of the maximum predicted heart rate. Normal hemodynamic response was seen. Stress symptoms included chest pressure and dizziness. Exercise capacity was low. This is submaximal stress test. 2. The overall quality of the study is good. Left ventricular cavity is noted to be normal on the rest and stress studies. Gated SPECT images reveal mild basal inferior/ inferoseptal dyskinesis. LVEF 43%. SPECT images demonstrate small size, predominantly reversible, moderate intensity perfusion defect in basal inferior/ inferoseptal myocardium. 3. Intermediate to high risk submaximal stress test. Echocardiogram  03/12/2018: 1. Left ventricle cavity is normal in size. Mild concentric hypertrophy of the left ventricle. Normal global wall motion. Normal diastolic filling pattern, normal LAP. Calculated EF 55%. 2. Trace mitral regurgitation. Trace tricuspid regurgitation. Trace pulmonic regurgitation.  Review of Systems Gwinda Maine, FNP-C; 04-05-18 3:38 PM) General Present- Weight Gain > 10lbs. (3-4 months). Not Present- Appetite Loss. Respiratory Not Present- Chronic Cough and Wakes  up from Sleep Wheezing or Short of Breath. Cardiovascular Present- Chest Pain (over the last 3-4 months with exertion) and Difficulty Breathing On Exertion. Not Present- Difficulty Breathing Lying Down. Gastrointestinal Not Present- Black, Tarry Stool and Difficulty Swallowing. Musculoskeletal Not Present- Decreased Range of Motion and Muscle Atrophy. Neurological Not Present- Attention Deficit. Psychiatric Not Present- Personality Changes and Suicidal Ideation. Endocrine Not Present- Cold Intolerance and Heat Intolerance. Hematology Not Present- Abnormal Bleeding. All other systems negative  Vitals Georgeanna Harrison; 04/05/2018 2:23 PM) 05-Apr-2018 2:18 PM Weight: 241 lb Height: 64in Body Surface Area: 2.12 m Body Mass Index: 41.37 kg/m  Pulse: 51 (Regular)  P.OX: 97% (Room air) BP: 128/71 (Sitting, Left Arm, Standard)  Physical Exam Gwinda Maine FNP-C; April 05, 2018 3:37 PM) General Mental Status-Alert. General Appearance-Cooperative and Appears stated age. Build & Nutrition-Moderately built and Morbidly obese.  Head and Neck Thyroid Gland Characteristics - normal size and consistency and no palpable nodules.  Chest and Lung Exam Chest and lung exam reveals -quiet, even and easy respiratory effort with no use of accessory muscles, non-tender and on auscultation, normal breath sounds, no adventitious sounds.  Cardiovascular Cardiovascular examination reveals -normal heart sounds, regular rate and rhythm with no murmurs, carotid auscultation reveals no bruits and abdominal aorta auscultation reveals no bruits and no prominent pulsation.  Abdomen Inspection Contour - Obese. Palpation/Percussion Normal exam - Non Tender and No hepatosplenomegaly.  Peripheral Vascular Lower Extremity Palpation - Edema - Bilateral - 1+ Pitting edema. Femoral pulse - Bilateral - Normal. Popliteal pulse - Bilateral - Normal. Dorsalis pedis pulse - Bilateral - Absent. Posterior tibial  pulse - Bilateral - 2+.  Neurologic Neurologic evaluation reveals -alert and oriented x 3 with no impairment of recent or remote memory. Motor-Grossly intact without any focal deficits.  Musculoskeletal Global Assessment Left Lower Extremity - no deformities, masses or tenderness, no known fractures. Right Lower Extremity - no deformities, masses or tenderness, no known fractures.   Results Gwinda Maine FNP-C; 04/05/2018 3:38  PM) Procedures  Name Value Date Echocardiography, transthoracic, real-time with image documentation (2D), includes M-mode recording, when performed, complete, with spectral Doppler echocardiography, and with color flow Doppler echocardiography ([id number]) Comments: Echocardiogram 03/12/2018: 1. Left ventricle cavity is normal in size. Mild concentric hypertrophy of the left ventricle. Normal global wall motion. Normal diastolic filling pattern, normal LAP. Calculated EF 55%. 2. Trace mitral regurgitation. Trace tricuspid regurgitation. Trace pulmonic regurgitation.  Performed: 03/12/2018 4:22 PM Myocardial perfusion imaging, tomographic (SPECT) (including attenuation correction, qualitative or quantitative wall motion, ejection fraction by first pass or gated technique, additional quantification, when performed); multiple studies, Comments: Exercise sestamibi stress test 03/23/2018: 1. The patient performed treadmill exercise using Bruce protocol, completing 6:30 minutes. The patient completed an estimated workload of 7 METS, reaching 82% of the maximum predicted heart rate. Normal hemodynamic response was seen. Stress symptoms included chest pressure and dizziness. Exercise capacity was low. This is submaximal stress test. 2. The overall quality of the study is good. Left ventricular cavity is noted to be normal on the rest and stress studies. Gated SPECT images reveal mild basal inferior/ inferoseptal dyskinesis. LVEF 43%. SPECT images demonstrate small size,  predominantly reversible, moderate intensity perfusion defect in basal inferior/ inferoseptal myocardium. 3. Intermediate to high risk submaximal stress test.  Performed: 03/23/2018 4:23 PM  Assessment & Plan Gwinda Maine FNP-C; 03/30/2018 3:38 PM) Abnormal nuclear stress test (R94.39) Angina pectoris (I20.9) Story: EKG 03/06/2018: Normal sinus rhythm at 62 bpm, normal axis, no evidence of ischemia.  Exercise sestamibi stress test 03/23/2018: 1. The patient performed treadmill exercise using Bruce protocol, completing 6:30 minutes. The patient completed an estimated workload of 7 METS, reaching 82% of the maximum predicted heart rate. Normal hemodynamic response was seen. Stress symptoms included chest pressure and dizziness. Exercise capacity was low. This is submaximal stress test. 2. The overall quality of the study is good. Left ventricular cavity is noted to be normal on the rest and stress studies. Gated SPECT images reveal mild basal inferior/ inferoseptal dyskinesis. LVEF 43%. SPECT images demonstrate small size, predominantly reversible, moderate intensity perfusion defect in basal inferior/ inferoseptal myocardium. 3. Intermediate to high risk submaximal stress test. Mixed hyperlipidemia (E78.2) Bilateral leg edema (R60.0) Story: Echocardiogram 03/12/2018: 1. Left ventricle cavity is normal in size. Mild concentric hypertrophy of the left ventricle. Normal global wall motion. Normal diastolic filling pattern, normal LAP. Calculated EF 55%. 2. Trace mitral regurgitation. Trace tricuspid regurgitation. Trace pulmonic regurgitation. Prediabetes (R73.03) Obesity, morbid, BMI 40.0-49.9 (E66.01) Laboratory examination (D66.44) Story: 05/27/2017: CBC normal. Cholesterol 170, triglycerides 305, HDL 40, LDL 69. TSH mildly elevated at 4.74. Hemoglobin A1c 5.9%.  Recommendation:  Patient with hyperlipidemia, prediabetes, obesity, recently evaluated by [place] for angina.  I discussed test  results with the patient and his wife, had abnormal nuclear stress test and was also symptomatic during his test. He has continued to have symptoms despite being on antianginal therapy. I recommended further evaluation with coronary angiogram. Schedule for cardiac catheterization, and possible angioplasty. We discussed regarding risks, benefits, alternatives to this including stress testing, CTA and continued medical therapy. Patient wants to proceed. Understands <1-2% risk of death, stroke, MI, urgent CABG, bleeding, infection, renal failure but not limited to these.  Blood pressure has improved with current medications. We'll hold off on making further changes until after his procedure. Had normal LVEF by echo. I've encouraged him to take it easy until he can have his procedure. He does have abnormal vascular exam; however, no symptoms of claudication.  He  will continue to need aggressive risk factor modification with obesity. Diet modifications were discussed. [facility] diet sheet was provided. I'll see him back after the procedure for further recommendations and evaluation.   CC: Philis Fendt, PA-C (PCP)    Signed by Gwinda Maine, FNP-C (03/30/2018 3:39 PM)

## 2018-04-06 ENCOUNTER — Other Ambulatory Visit: Payer: Self-pay

## 2018-04-06 ENCOUNTER — Encounter (HOSPITAL_COMMUNITY)
Admission: RE | Disposition: A | Discharge status: Home / Self Care | Payer: Self-pay | Source: Home / Self Care | Attending: Cardiology

## 2018-04-06 ENCOUNTER — Ambulatory Visit (HOSPITAL_COMMUNITY)
Admission: RE | Admit: 2018-04-06 | Discharge: 2018-04-06 | Disposition: A | Discharge status: Home / Self Care | Payer: BC Managed Care – PPO | Attending: Cardiology | Admitting: Cardiology

## 2018-04-06 DIAGNOSIS — R7303 Prediabetes: Secondary | ICD-10-CM | POA: Diagnosis not present

## 2018-04-06 DIAGNOSIS — Z7982 Long term (current) use of aspirin: Secondary | ICD-10-CM | POA: Diagnosis not present

## 2018-04-06 DIAGNOSIS — R600 Localized edema: Secondary | ICD-10-CM | POA: Insufficient documentation

## 2018-04-06 DIAGNOSIS — E782 Mixed hyperlipidemia: Secondary | ICD-10-CM | POA: Diagnosis not present

## 2018-04-06 DIAGNOSIS — Z79899 Other long term (current) drug therapy: Secondary | ICD-10-CM | POA: Diagnosis not present

## 2018-04-06 DIAGNOSIS — K219 Gastro-esophageal reflux disease without esophagitis: Secondary | ICD-10-CM | POA: Diagnosis not present

## 2018-04-06 DIAGNOSIS — I209 Angina pectoris, unspecified: Secondary | ICD-10-CM | POA: Diagnosis present

## 2018-04-06 DIAGNOSIS — Z8249 Family history of ischemic heart disease and other diseases of the circulatory system: Secondary | ICD-10-CM | POA: Insufficient documentation

## 2018-04-06 DIAGNOSIS — Z955 Presence of coronary angioplasty implant and graft: Secondary | ICD-10-CM

## 2018-04-06 DIAGNOSIS — I25119 Atherosclerotic heart disease of native coronary artery with unspecified angina pectoris: Secondary | ICD-10-CM | POA: Diagnosis not present

## 2018-04-06 DIAGNOSIS — Z6841 Body Mass Index (BMI) 40.0 and over, adult: Secondary | ICD-10-CM | POA: Insufficient documentation

## 2018-04-06 HISTORY — PX: LEFT HEART CATH AND CORONARY ANGIOGRAPHY: CATH118249

## 2018-04-06 HISTORY — PX: CORONARY STENT INTERVENTION: CATH118234

## 2018-04-06 HISTORY — PX: CORONARY THROMBECTOMY: CATH118304

## 2018-04-06 LAB — POCT ACTIVATED CLOTTING TIME: Activated Clotting Time: 296 seconds

## 2018-04-06 SURGERY — LEFT HEART CATH AND CORONARY ANGIOGRAPHY
Anesthesia: LOCAL

## 2018-04-06 MED ORDER — SODIUM CHLORIDE 0.9 % WEIGHT BASED INFUSION: 3.0000 mL/kg/h | INTRAVENOUS | Status: DC

## 2018-04-06 MED ORDER — NITROGLYCERIN 1 MG/10 ML FOR IR/CATH LAB
INTRA_ARTERIAL | Status: AC
  Filled 2018-04-06: qty 10

## 2018-04-06 MED ORDER — TICAGRELOR 90 MG PO TABS
ORAL_TABLET | ORAL | Status: DC | PRN
  Administered 2018-04-06: 180 mg via ORAL

## 2018-04-06 MED ORDER — ONDANSETRON HCL 4 MG/2ML IJ SOLN: 4.0000 mg | Freq: Four times a day (QID) | INTRAMUSCULAR | Status: DC | PRN

## 2018-04-06 MED ORDER — TICAGRELOR 90 MG PO TABS
ORAL_TABLET | ORAL | Status: AC
  Filled 2018-04-06: qty 2

## 2018-04-06 MED ORDER — SODIUM CHLORIDE 0.9 % WEIGHT BASED INFUSION: 1.0000 mL/kg/h | INTRAVENOUS | Status: DC

## 2018-04-06 MED ORDER — MIDAZOLAM HCL 2 MG/2ML IJ SOLN
INTRAMUSCULAR | Status: AC
  Filled 2018-04-06: qty 2

## 2018-04-06 MED ORDER — FENTANYL CITRATE (PF) 100 MCG/2ML IJ SOLN
INTRAMUSCULAR | Status: DC | PRN
  Administered 2018-04-06: 25 ug via INTRAVENOUS
  Administered 2018-04-06: 50 ug via INTRAVENOUS

## 2018-04-06 MED ORDER — IOHEXOL 350 MG/ML SOLN
INTRAVENOUS | Status: DC | PRN
  Administered 2018-04-06: 130 mL via INTRAVENOUS

## 2018-04-06 MED ORDER — SODIUM CHLORIDE 0.9% FLUSH: 3.0000 mL | INTRAVENOUS | Status: DC | PRN

## 2018-04-06 MED ORDER — HEPARIN (PORCINE) IN NACL 1000-0.9 UT/500ML-% IV SOLN
INTRAVENOUS | Status: AC
  Filled 2018-04-06: qty 1000

## 2018-04-06 MED ORDER — LIDOCAINE HCL (PF) 1 % IJ SOLN
INTRAMUSCULAR | Status: AC
  Filled 2018-04-06: qty 30

## 2018-04-06 MED ORDER — ASPIRIN 81 MG PO CHEW
81.0000 mg | CHEWABLE_TABLET | ORAL | Status: AC
  Administered 2018-04-06 (×2): 81 mg via ORAL
  Filled 2018-04-06: qty 1

## 2018-04-06 MED ORDER — MIDAZOLAM HCL 2 MG/2ML IJ SOLN
INTRAMUSCULAR | Status: DC | PRN
  Administered 2018-04-06 (×3): 1 mg via INTRAVENOUS

## 2018-04-06 MED ORDER — SODIUM CHLORIDE 0.9 % IV SOLN: 250.0000 mL | INTRAVENOUS | Status: DC | PRN

## 2018-04-06 MED ORDER — SODIUM CHLORIDE 0.9% FLUSH: 3.0000 mL | Freq: Two times a day (BID) | INTRAVENOUS | Status: DC

## 2018-04-06 MED ORDER — ASPIRIN 81 MG PO CHEW: 81.0000 mg | CHEWABLE_TABLET | ORAL | Status: DC

## 2018-04-06 MED ORDER — LIDOCAINE HCL (PF) 1 % IJ SOLN
INTRAMUSCULAR | Status: DC | PRN
  Administered 2018-04-06: 2 mL

## 2018-04-06 MED ORDER — SODIUM CHLORIDE 0.9 % WEIGHT BASED INFUSION
3.0000 mL/kg/h | INTRAVENOUS | Status: AC
  Administered 2018-04-06: 3 mL/kg/h via INTRAVENOUS

## 2018-04-06 MED ORDER — ACETAMINOPHEN 325 MG PO TABS: 650.0000 mg | ORAL_TABLET | ORAL | Status: DC | PRN

## 2018-04-06 MED ORDER — FENTANYL CITRATE (PF) 100 MCG/2ML IJ SOLN
INTRAMUSCULAR | Status: AC
  Filled 2018-04-06: qty 2

## 2018-04-06 MED ORDER — HEPARIN SODIUM (PORCINE) 1000 UNIT/ML IJ SOLN
INTRAMUSCULAR | Status: AC
  Filled 2018-04-06: qty 1

## 2018-04-06 MED ORDER — VERAPAMIL HCL 2.5 MG/ML IV SOLN
INTRAVENOUS | Status: DC | PRN
  Administered 2018-04-06 (×2): 10 mL via INTRA_ARTERIAL

## 2018-04-06 MED ORDER — ANGIOPLASTY BOOK
Freq: Once | Status: DC
  Filled 2018-04-06: qty 1

## 2018-04-06 MED ORDER — NITROGLYCERIN 1 MG/10 ML FOR IR/CATH LAB
INTRA_ARTERIAL | Status: DC | PRN
  Administered 2018-04-06 (×2): 200 ug via INTRACORONARY

## 2018-04-06 MED ORDER — TICAGRELOR 90 MG PO TABS: 90.0000 mg | ORAL_TABLET | Freq: Two times a day (BID) | ORAL | 0 refills | Status: DC

## 2018-04-06 MED ORDER — HYDRALAZINE HCL 20 MG/ML IJ SOLN: 5.0000 mg | INTRAMUSCULAR | Status: AC | PRN

## 2018-04-06 MED ORDER — HEPARIN SODIUM (PORCINE) 1000 UNIT/ML IJ SOLN
INTRAMUSCULAR | Status: DC | PRN
  Administered 2018-04-06: 5000 [IU] via INTRAVENOUS
  Administered 2018-04-06: 2000 [IU] via INTRAVENOUS
  Administered 2018-04-06: 5000 [IU] via INTRAVENOUS

## 2018-04-06 MED ORDER — HEPARIN (PORCINE) IN NACL 1000-0.9 UT/500ML-% IV SOLN
INTRAVENOUS | Status: DC | PRN
  Administered 2018-04-06 (×2): 500 mL

## 2018-04-06 MED FILL — BRILINTA 90 MG TABLET: 90 | 30 days supply | Qty: 60 | Fill #0

## 2018-04-06 SURGICAL SUPPLY — 18 items
BALLN SAPPHIRE 2.0X15 (BALLOONS) ×2
BALLN SAPPHIRE ~~LOC~~ 3.75X12 (BALLOONS) ×1 IMPLANT
BALLOON SAPPHIRE 2.0X15 (BALLOONS) IMPLANT
CATH EXTRAC PRONTO 5.5F 138CM (CATHETERS) ×1 IMPLANT
CATH LAUNCHER 6FR JR4 (CATHETERS) ×1 IMPLANT
CATH OPTITORQUE TIG 4.0 5F (CATHETERS) ×1 IMPLANT
CATH VISTA GUIDE 6FR JR4 (CATHETERS) ×1 IMPLANT
DEVICE RAD COMP TR BAND LRG (VASCULAR PRODUCTS) ×1 IMPLANT
GLIDESHEATH SLEND A-KIT 6F 20G (SHEATH) ×1 IMPLANT
GUIDEWIRE INQWIRE 1.5J.035X260 (WIRE) IMPLANT
INQWIRE 1.5J .035X260CM (WIRE) ×2
KIT ENCORE 26 ADVANTAGE (KITS) ×1 IMPLANT
KIT HEART LEFT (KITS) ×2 IMPLANT
PACK CARDIAC CATHETERIZATION (CUSTOM PROCEDURE TRAY) ×2 IMPLANT
STENT ORSIRO 3.5X22 (Permanent Stent) ×1 IMPLANT
TRANSDUCER W/STOPCOCK (MISCELLANEOUS) ×2 IMPLANT
TUBING CIL FLEX 10 FLL-RA (TUBING) ×2 IMPLANT
WIRE SION BLUE 180 (WIRE) ×1 IMPLANT

## 2018-04-06 NOTE — Interval H&P Note (Signed)
History and Physical Interval Note:  04/06/2018 7:38 AM  Drew [name]  has presented today for surgery, with the diagnosis of UA  The various methods of treatment have been discussed with the patient and family. After consideration of risks, benefits and other options for treatment, the patient has consented to  Procedure(s): LEFT HEART CATH AND CORONARY ANGIOGRAPHY (N/A) and possible angioplasty as a surgical intervention .  The patient's history has been reviewed, patient examined, no change in status, stable for surgery.  I have reviewed the patient's chart and labs.  Questions were answered to the patient's satisfaction.   Symptom Status: Ischemic Symptoms Non-invasive Testing: High risk If no or indeterminate stress test, FFR/iFR results in all diseased vessels: N/A Diabetes Mellitus: Yes S/P CABG: No Antianginal therapy (number of long-acting drugs): >=2 Patient undergoing renal transplant: No Patient undergoing percutaneous valve procedure: No   1 Vessel Disease No proximal LAD involvement, No proximal left dominant LCX involvement  PCI: A (8);  Indication 2  CABG: M (6);  Indication 2 Proximal left dominant LCX involvement  PCI: A (8);  Indication 5  CABG: A (8);  Indication 5 Proximal LAD involvement  PCI: A (8);  Indication 5  CABG: A (8);  Indication 5  2 Vessel Disease No proximal LAD involvement  PCI: A (8);  Indication 8  CABG: A (7);  Indication 8 Proximal LAD involvement  PCI: A (8);  Indication 14  CABG: A (9);  Indication 14  3 Vessel Disease Low disease complexity (e.g., focal stenoses, SYNTAX <=22)  PCI: A (7);  Indication 19  CABG: A (9);  Indication 19 Intermediate or high disease complexity (e.g., SYNTAX >=23)  PCI: M (6);  Indication 23  CABG: A (9);  Indication 23  Left Main Disease Isolated LMCA disease: ostial or midshaft  PCI: A (7);  Indication 24  CABG: A (9);  Indication 24 Isolated LMCA disease: bifurcation involvement  PCI: M (6);   Indication 25  CABG: A (9);  Indication 25 LMCA ostial or midshaft, concurrent low disease burden multivessel disease (e.g., 1-2 additional focal stenoses, SYNTAX <=22)  PCI: A (7);  Indication 26  CABG: A (9);  Indication 26 LMCA ostial or midshaft, concurrent intermediate or high disease burden multivessel disease (e.g., 1-2 additional bifurcation stenoses, long stenoses, SYNTAX >=23)  PCI: M (4);  Indication 27  CABG: A (9);  Indication 27 LMCA bifurcation involvement, concurrent low disease burden multivessel disease (e.g., 1-2 additional focal stenoses, SYNTAX <=22)  PCI: M (6);  Indication 28  CABG: A (9);  Indication 28 LMCA bifurcation involvement, concurrent intermediate or high disease burden multivessel disease (e.g., 1-2 additional bifurcation stenoses, long stenoses, SYNTAX >=23)  PCI: R (3);  Indication 29  CABG: A (9);  Indication 29  Notes:  A indicates appropriate. M indicates may be appropriate. R indicates rarely appropriate. Number in parentheses is median score for that indication. Reclassify indicates number of functionally diseased vessels should be decreased given negative FFR/iFR. Re-evaluate the scenario interpreting any FFR/iFR negative vessel as being not significantly stenosed.  Disease means involved vessel provides flow to a sufficient amount of myocardium to be clinically important.  If FFR testing indicates a vessel is not significant, that vessel should not be considered diseased (and the patient should be reclassified with respect to extent of functionally significant disease).  Proximal LAD + proximal left dominant LCX is considered 3 vessel CAD  2 Vessel CAD with FFR/iFR abnormal in only 1 but not both is  considered 1 vessel CAD  Disease complexity includes occlusion, bifurcation, trifurcation, ostial, >20 mm, tortuosity, calcification, thrombus  LMCA disease is >=50% by angiography, MLD <2.8 mm, MLA <6 mm2; MLA 6-7.5 mm2 requires further physiologic  See  Table B for risk stratification based on noninvasive testing  Journal of the [employer] of Cardiology Mar 2017, 23391; DOI: 10.1016/j.jacc.2017.02.001 [employer].de.2017.02.001.full-text.pdf This App  2018 by the Society for Cardiovascular Angiography and Interventions    Adrian Prows

## 2018-04-06 NOTE — Discharge Instructions (Signed)
Drink plenty of fluids  °Keep right arm at or above heart level.  °Radial Site Care ° °This sheet gives you information about how to care for yourself after your procedure. Your health care provider may also give you more specific instructions. If you have problems or questions, contact your health care provider. °What can I expect after the procedure? °After the procedure, it is common to have: °· Bruising and tenderness at the catheter insertion area. °Follow these instructions at home: °Medicines °· Take over-the-counter and prescription medicines only as told by your health care provider. °Insertion site care °· Follow instructions from your health care provider about how to take care of your insertion site. Make sure you: °? Wash your hands with soap and water before you change your bandage (dressing). If soap and water are not available, use hand sanitizer. °? Change your dressing as told by your health care provider. °? Leave stitches (sutures), skin glue, or adhesive strips in place. These skin closures may need to stay in place for 2 weeks or longer. If adhesive strip edges start to loosen and curl up, you may trim the loose edges. Do not remove adhesive strips completely unless your health care provider tells you to do that. °· Check your insertion site every day for signs of infection. Check for: °? Redness, swelling, or pain. °? Fluid or blood. °? Pus or a bad smell. °? Warmth. °· Do not take baths, swim, or use a hot tub until your health care provider approves. °· You may shower 24-48 hours after the procedure, or as directed by your health care provider. °? Remove the dressing and gently wash the site with plain soap and water. °? Pat the area dry with a clean towel. °? Do not rub the site. That could cause bleeding. °· Do not apply powder or lotion to the site. °Activity ° °· For 24 hours after the procedure, or as directed by your health care provider: °? Do not flex or bend the affected arm. °? Do  not push or pull heavy objects with the affected arm. °? Do not drive yourself home from the hospital or clinic. You may drive 24 hours after the procedure unless your health care provider tells you not to. °? Do not operate machinery or power tools. °· Do not lift anything that is heavier than 10 lb (4.5 kg), or the limit that you are told, until your health care provider says that it is safe. °· Ask your health care provider when it is okay to: °? Return to work or school. °? Resume usual physical activities or sports. °? Resume sexual activity. °General instructions °· If the catheter site starts to bleed, raise your arm and put firm pressure on the site. If the bleeding does not stop, get help right away. This is a medical emergency. °· If you went home on the same day as your procedure, a responsible adult should be with you for the first 24 hours after you arrive home. °· Keep all follow-up visits as told by your health care provider. This is important. °Contact a health care provider if: °· You have a fever. °· You have redness, swelling, or yellow drainage around your insertion site. °Get help right away if: °· You have unusual pain at the radial site. °· The catheter insertion area swells very fast. °· The insertion area is bleeding, and the bleeding does not stop when you hold steady pressure on the area. °· Your arm or   hand becomes pale, cool, tingly, or numb. °These symptoms may represent a serious problem that is an emergency. Do not wait to see if the symptoms will go away. Get medical help right away. Call your local emergency services (911 in the [place] Do not drive yourself to the hospital. °Summary °· After the procedure, it is common to have bruising and tenderness at the site. °· Follow instructions from your health care provider about how to take care of your radial site wound. Check the wound every day for signs of infection. °· Do not lift anything that is heavier than 10 lb (4.5 kg), or the  limit that you are told, until your health care provider says that it is safe. °This information is not intended to replace advice given to you by your health care provider. Make sure you discuss any questions you have with your health care provider. °Document Released: 04/16/2010 Document Revised: 04/19/2017 Document Reviewed: 04/19/2017 °Elsevier Interactive Patient Education © 2019 [employer] Inc. ° °

## 2018-04-06 NOTE — Progress Notes (Signed)
Dr Einar Gip in to see pt and pt family

## 2018-04-06 NOTE — Progress Notes (Signed)
[id number] Discussed with pt the importance of brililnta with stent.. Pharmacist in to give brilinta. Reviewed NTG use, ex ed, heart healthy food choices, CRP 2. Answered questions from pt and family. Understanding voiced. Referred to [place] CRP 2. Graylon Good RN BSN 04/06/2018 11:28 AM

## 2018-04-09 ENCOUNTER — Encounter (HOSPITAL_COMMUNITY): Payer: Self-pay | Admitting: Cardiology

## 2018-04-10 ENCOUNTER — Telehealth (HOSPITAL_COMMUNITY): Payer: Self-pay

## 2018-04-10 NOTE — Telephone Encounter (Signed)
Pt insurance is active and benefits verified through [employer]. Co-pay $0.00, DED $1,250.00/$0.00 met, out of pocket $4,890.00/$120.65 met, co-insurance 0%. No pre-authorization required. Passport, 04/10/2018 @ 1201PM, REF# [id number]  Will contact patient to see if he is interested in the Cardiac Rehab Program. If interested, patient will need to complete follow up appt. Once completed, patient will be contacted for scheduling upon review by the RN Navigator.

## 2018-04-10 NOTE — Telephone Encounter (Signed)
Called patient to see if he is interested in the Cardiac Rehab Program. Patient expressed interest. Explained scheduling process and went over insurance, patient verbalized understanding. Will contact patient for scheduling once f/u has been completed.  °

## 2018-04-25 ENCOUNTER — Telehealth (HOSPITAL_COMMUNITY): Payer: Self-pay

## 2018-04-25 NOTE — Telephone Encounter (Signed)
Attempted to call patient in regards to Cardiac Rehab - LM on VM 

## 2018-05-04 ENCOUNTER — Other Ambulatory Visit: Payer: Self-pay | Admitting: Cardiology

## 2018-05-04 ENCOUNTER — Encounter (HOSPITAL_COMMUNITY): Payer: Self-pay

## 2018-05-04 NOTE — Telephone Encounter (Signed)
Attempted to call pt a 2nd time- LM ON VM ° °Mailed letter out °

## 2018-05-05 LAB — BASIC METABOLIC PANEL
BUN / CREAT RATIO: 21 (ref 10–24)
BUN: 24 mg/dL (ref 8–27)
CALCIUM: 9.5 mg/dL (ref 8.6–10.2)
CO2: 25 mmol/L (ref 20–29)
Chloride: 103 mmol/L (ref 96–106)
Creatinine, Ser: 1.13 mg/dL (ref 0.76–1.27)
GFR calc Af Amer: 79 mL/min/{1.73_m2} (ref >59)
GFR calc non Af Amer: 68 mL/min/{1.73_m2} (ref >59)
Glucose: 98 mg/dL (ref 65–99)
Potassium: 4.8 mmol/L (ref 3.5–5.2)
Sodium: 142 mmol/L (ref 134–144)

## 2018-05-16 NOTE — Telephone Encounter (Signed)
3rd Attempted to call patient in regards to Cardiac Rehab - LM on VM Gloria W. Support Rep II

## 2018-05-28 ENCOUNTER — Telehealth (HOSPITAL_COMMUNITY): Payer: Self-pay

## 2018-05-28 NOTE — Telephone Encounter (Signed)
No response from pt Closed referral Tedra Sen[place]. Support Rep II

## 2018-06-22 ENCOUNTER — Other Ambulatory Visit: Payer: Self-pay | Admitting: Cardiology

## 2018-06-22 ENCOUNTER — Other Ambulatory Visit: Payer: Self-pay | Admitting: Gastroenterology

## 2018-07-20 ENCOUNTER — Ambulatory Visit: Payer: Self-pay | Admitting: Cardiology

## 2018-08-03 ENCOUNTER — Other Ambulatory Visit: Payer: Self-pay | Admitting: Physician Assistant

## 2018-08-03 DIAGNOSIS — E785 Hyperlipidemia, unspecified: Secondary | ICD-10-CM

## 2018-08-14 ENCOUNTER — Other Ambulatory Visit: Payer: Self-pay | Admitting: Cardiology

## 2018-08-15 LAB — CBC WITH DIFFERENTIAL/PLATELET
Basophils Absolute: 0.1 10*3/uL (ref 0.0–0.2)
Basos: 1 %
EOS (ABSOLUTE): 0.2 10*3/uL (ref 0.0–0.4)
Eos: 3 %
Hematocrit: 39.9 % (ref 37.5–51.0)
Hemoglobin: 14 g/dL (ref 13.0–17.7)
Immature Grans (Abs): 0 10*3/uL (ref 0.0–0.1)
Immature Granulocytes: 0 %
Lymphocytes Absolute: 2 10*3/uL (ref 0.7–3.1)
Lymphs: 29 %
MCH: 31.9 pg (ref 26.6–33.0)
MCHC: 35.1 g/dL (ref 31.5–35.7)
MCV: 91 fL (ref 79–97)
Monocytes Absolute: 0.5 10*3/uL (ref 0.1–0.9)
Monocytes: 7 %
Neutrophils Absolute: 4.3 10*3/uL (ref 1.4–7.0)
Neutrophils: 60 %
Platelets: 166 10*3/uL (ref 150–450)
RBC: 4.39 x10E6/uL (ref 4.14–5.80)
RDW: 13 % (ref 11.6–15.4)
WBC: 7.1 10*3/uL (ref 3.4–10.8)

## 2018-08-15 LAB — COMPREHENSIVE METABOLIC PANEL
ALT: 33 IU/L (ref 0–44)
AST: 27 IU/L (ref 0–40)
Albumin/Globulin Ratio: 1.8 (ref 1.2–2.2)
Albumin: 4.4 g/dL (ref 3.8–4.8)
Alkaline Phosphatase: 78 IU/L (ref 39–117)
BUN/Creatinine Ratio: 16 (ref 10–24)
BUN: 20 mg/dL (ref 8–27)
Bilirubin Total: 0.6 mg/dL (ref 0.0–1.2)
CO2: 25 mmol/L (ref 20–29)
Calcium: 9.4 mg/dL (ref 8.6–10.2)
Chloride: 100 mmol/L (ref 96–106)
Creatinine, Ser: 1.23 mg/dL (ref 0.76–1.27)
GFR calc Af Amer: 71 mL/min/{1.73_m2} (ref >59)
GFR calc non Af Amer: 62 mL/min/{1.73_m2} (ref >59)
Globulin, Total: 2.4 g/dL (ref 1.5–4.5)
Glucose: 106 mg/dL — ABNORMAL HIGH (ref 65–99)
Potassium: 4.3 mmol/L (ref 3.5–5.2)
Sodium: 138 mmol/L (ref 134–144)
Total Protein: 6.8 g/dL (ref 6.0–8.5)

## 2018-08-15 LAB — LIPID PANEL W/O CHOL/HDL RATIO
Cholesterol, Total: 153 mg/dL (ref 100–199)
HDL: 39 mg/dL — ABNORMAL LOW (ref >39)
LDL Calculated: 75 mg/dL (ref 0–99)
Triglycerides: 197 mg/dL — ABNORMAL HIGH (ref 0–149)
VLDL Cholesterol Cal: 39 mg/dL (ref 5–40)

## 2018-08-16 ENCOUNTER — Ambulatory Visit: Payer: BC Managed Care – PPO | Admitting: Cardiology

## 2018-08-21 ENCOUNTER — Ambulatory Visit: Payer: BC Managed Care – PPO | Admitting: Cardiology

## 2018-08-21 ENCOUNTER — Encounter: Payer: Self-pay | Admitting: Cardiology

## 2018-08-21 ENCOUNTER — Other Ambulatory Visit: Payer: Self-pay

## 2018-08-21 VITALS — BP 99/72 | HR 54 | Ht 64.5 in | Wt 217.0 lb

## 2018-08-21 DIAGNOSIS — I10 Essential (primary) hypertension: Secondary | ICD-10-CM | POA: Diagnosis not present

## 2018-08-21 DIAGNOSIS — I25118 Atherosclerotic heart disease of native coronary artery with other forms of angina pectoris: Secondary | ICD-10-CM | POA: Diagnosis not present

## 2018-08-21 DIAGNOSIS — E782 Mixed hyperlipidemia: Secondary | ICD-10-CM

## 2018-08-21 DIAGNOSIS — Z6841 Body Mass Index (BMI) 40.0 and over, adult: Secondary | ICD-10-CM

## 2018-08-21 DIAGNOSIS — R7303 Prediabetes: Secondary | ICD-10-CM

## 2018-08-21 MED ORDER — NITROGLYCERIN 0.4 MG SL SUBL: 0.4000 mg | SUBLINGUAL_TABLET | SUBLINGUAL | 2 refills | Status: AC | PRN

## 2018-08-21 NOTE — Progress Notes (Signed)
Virtual Visit via Video Note: This visit type was conducted due to national recommendations for restrictions regarding the COVID-19 Pandemic (e.g. social distancing).  This format is felt to be most appropriate for this patient at this time.  All issues noted in this document were discussed and addressed.  No physical exam was performed (except for noted visual exam findings with Telehealth visits).  The patient has consented to conduct a Telehealth visit and understands insurance will be billed.   I connected with@, on 08/21/18 at  by a video enabled telemedicine application and verified that I am speaking with the correct person using two identifiers.   I discussed the limitations of evaluation and management by telemedicine and the availability of in person appointments. The patient expressed understanding and agreed to proceed.   I have discussed with patient regarding the safety during COVID Pandemic and steps and precautions to be taken including social distancing, frequent hand wash and use of detergent soap, gels with the patient. I asked the patient to avoid touching mouth, nose, eyes, ears with the hands. I encouraged regular walking around the neighborhood and exercise and regular diet, as long as social distancing can be maintained.  Primary Physician/Referring:  Tereasa Coop, PA-C  Patient ID: Drew [name], male    DOB: 20-Jun-1955, 63 y.o.   MRN: [id number]  Chief Complaint  Patient presents with  . Coronary Artery Disease  . Results    labs  . Follow-up    29mo    HPI: Drew [name]  is a 63 y.o. male  Caucasian male who presents for follow-up of angina pectoris, underwent coronary angiography on 04/06/2018, had a very large occluded RCA, thrombotic occlusion, underwent thrombectomy and successful DES stenting with a 3.5 mm stent. He now presents for follow-up. PMH significant for hyperlipidemia, prediabetes, obesity.  Since angioplasty he has not had occasional angina  pectoris, but has not used NTG. Had night leg cramps and still continues to have cold feet and hands. Denies any claudication symptoms. No change in weight. He is still walking daily for 1.2 miles a day. Dyspnea is stable.   Past Medical History:  Diagnosis Date  . Colon polyps 2010  . GERD (gastroesophageal reflux disease)   . HLD (hyperlipidemia)     Past Surgical History:  Procedure Laterality Date  . CATARACT EXTRACTION    . CORONARY STENT INTERVENTION N/A 04/06/2018   Procedure: CORONARY STENT INTERVENTION;  Surgeon: Adrian Prows, MD;  Location: [facility];  Service: Cardiovascular;  Laterality: N/A;  . CORONARY THROMBECTOMY N/A 04/06/2018   Procedure: Coronary Thrombectomy;  Surgeon: Adrian Prows, MD;  Location: [facility];  Service: Cardiovascular;  Laterality: N/A;  . LEFT HEART CATH AND CORONARY ANGIOGRAPHY N/A 04/06/2018   Procedure: LEFT HEART CATH AND CORONARY ANGIOGRAPHY;  Surgeon: Adrian Prows, MD;  Location: [facility];  Service: Cardiovascular;  Laterality: N/A;    Social History   Socioeconomic History  . Marital status: Married    Spouse name: Not on file  . Number of children: 2  . Years of education: Not on file  . Highest education level: Not on file  Occupational History  . Occupation: Insurance account manager  Social Needs  . Financial resource strain: Not on file  . Food insecurity:    Worry: Not on file    Inability: Not on file  . Transportation needs:    Medical: Not on file    Non-medical: Not on file  Tobacco Use  .  Smoking status: Never Smoker  . Smokeless tobacco: Never Used  Substance and Sexual Activity  . Alcohol use: Yes    Alcohol/week: 0.0 standard drinks    Comment: 1-2 per day  . Drug use: No  . Sexual activity: Not on file  Lifestyle  . Physical activity:    Days per week: Not on file    Minutes per session: Not on file  . Stress: Not on file  Relationships  . Social connections:    Talks on phone: Not on file     Gets together: Not on file    Attends religious service: Not on file    Active member of club or organization: Not on file    Attends meetings of clubs or organizations: Not on file    Relationship status: Not on file  . Intimate partner violence:    Fear of current or ex partner: Not on file    Emotionally abused: Not on file    Physically abused: Not on file    Forced sexual activity: Not on file  Other Topics Concern  . Not on file  Social History Narrative  . Not on file    Review of Systems  Constitution: Negative for chills, decreased appetite, malaise/fatigue and weight gain.  Cardiovascular: Positive for chest pain and dyspnea on exertion (chronic). Negative for leg swelling and syncope.  Endocrine: Negative for cold intolerance.  Hematologic/Lymphatic: Does not bruise/bleed easily.  Musculoskeletal: Negative for joint swelling.  Gastrointestinal: Negative for abdominal pain, anorexia, change in bowel habit, hematochezia and melena.  Neurological: Negative for headaches and light-headedness.  Psychiatric/Behavioral: Negative for depression and substance abuse.  All other systems reviewed and are negative.     Objective  Blood pressure 99/72, pulse (!) 54, height 5' 4.5" (1.638 m), weight 217 lb (98.4 kg). Body mass index is 36.67 kg/m.    Physical exam not performed or limited due to virtual visit.  Patient appeared to be in no distress, Neck was supple, respiration was not labored.  Please see exam details from prior visit is as below.  Physical Exam  Constitutional: He appears well-developed. No distress.  Morbidly obese  HENT:  Head: Atraumatic.  Eyes: Conjunctivae are normal.  Neck: Neck supple. No thyromegaly present.  Short neck and difficult to evaluate JVP  Cardiovascular: Normal rate, regular rhythm and normal heart sounds. Exam reveals no gallop.  No murmur heard. Pulses:      Carotid pulses are 2+ on the right side and 2+ on the left side.       Dorsalis pedis pulses are 2+ on the right side and 2+ on the left side.       Posterior tibial pulses are 2+ on the right side and 2+ on the left side.  Femoral and popliteal pulse difficult to feel due to patient's body habitus. Trace bilateral ankle edema.   Pulmonary/Chest: Effort normal and breath sounds normal.  Abdominal: Soft. Bowel sounds are normal.  Obese. Pannus present  Musculoskeletal: Normal range of motion.        General: No edema.  Neurological: He is alert.  Skin: Skin is warm and dry.  Psychiatric: He has a normal mood and affect.   Radiology: No results found.  Laboratory examination:    CMP Latest Ref Rng & Units 08/14/2018 05/04/2018 03/06/2018  Glucose 65 - 99 mg/dL 106(H) 98 100(H)  BUN 8 - 27 mg/dL 20 24 16   Creatinine 0.76 - 1.27 mg/dL 1.23 1.13 1.00  Sodium 134 -  144 mmol/L 138 142 138  Potassium 3.5 - 5.2 mmol/L 4.3 4.8 4.2  Chloride 96 - 106 mmol/L 100 103 103  CO2 20 - 29 mmol/L 25 25 -  Calcium 8.6 - 10.2 mg/dL 9.4 9.5 -  Total Protein 6.0 - 8.5 g/dL 6.8 - -  Total Bilirubin 0.0 - 1.2 mg/dL 0.6 - -  Alkaline Phos 39 - 117 IU/L 78 - -  AST 0 - 40 IU/L 27 - -  ALT 0 - 44 IU/L 33 - -   CBC Latest Ref Rng & Units 08/14/2018 03/06/2018 05/27/2017  WBC 3.4 - 10.8 x10E3/uL 7.1 - 7.3  Hemoglobin 13.0 - 17.7 g/dL 14.0 15.0 14.6  Hematocrit 37.5 - 51.0 % 39.9 44.0 43.3  Platelets 150 - 450 x10E3/uL 166 - 185   Lipid Panel     Component Value Date/Time   CHOL 153 08/14/2018 1056   TRIG 197 (H) 08/14/2018 1056   HDL 39 (L) 08/14/2018 1056   CHOLHDL 4.3 05/27/2017 1511   CHOLHDL 7.5 (H) 04/20/2015 1607   VLDL 70 (H) 04/20/2015 1607   LDLCALC 75 08/14/2018 1056   HEMOGLOBIN A1C Lab Results  Component Value Date   HGBA1C 5.9 (H) 05/27/2017   TSH Recent Labs    11/09/17 1139  TSH 3.430    PRN Meds:. Medications Discontinued During This Encounter  Medication Reason  . nitroGLYCERIN (NITROSTAT) 0.4 MG SL tablet Reorder   Current Meds   Medication Sig  . acetaminophen (TYLENOL) 650 MG CR tablet Take 650 mg by mouth every 8 (eight) hours as needed for pain.  Marland Kitchen amLODipine (NORVASC) 5 MG tablet Take 5 mg by mouth daily.  Marland Kitchen aspirin EC 81 MG tablet Take 81 mg by mouth daily.  . famotidine (PEPCID) 20 MG tablet TAKE 1 TO 2 TABLETS BY MOUTH ONCE DAILY AS NEEDED FOR HEARTBURN OR INDIGESTION  . HYDROcodone-acetaminophen (NORCO) 5-325 MG tablet Take 1 tablet by mouth daily as needed for severe pain (For severe pain only.). For severe pain only. (Patient taking differently: Take 0.5-1 tablets by mouth daily as needed for severe pain (For severe pain only.). For severe pain only.)  . metoprolol tartrate (LOPRESSOR) 25 MG tablet Take 25 mg by mouth 2 (two) times daily.  . nitroGLYCERIN (NITROSTAT) 0.4 MG SL tablet Place 1 tablet (0.4 mg total) under the tongue every 5 (five) minutes x 3 doses as needed for chest pain.  Marland Kitchen olmesartan-hydrochlorothiazide (BENICAR HCT) 20-12.5 MG tablet TAKE 1 TABLET BY MOUTH ONCE DAILY IN THE MORNING  . rosuvastatin (CRESTOR) 20 MG tablet Take 10 mg by mouth daily.  . ticagrelor (BRILINTA) 90 MG TABS tablet Take 1 tablet (90 mg total) by mouth 2 (two) times daily.  . [DISCONTINUED] nitroGLYCERIN (NITROSTAT) 0.4 MG SL tablet Place 0.4 mg under the tongue every 5 (five) minutes x 3 doses as needed for chest pain.    Cardiac Studies:  Coronary angiogram 04/06/2018: Very large RCA occluded in the proximal segment S/P thrombectomy followed by Orsiro 3.5x22 DES. Normal LVEF, mild disease in other vessels.  Echocardiogram 03/12/2018: 1. Left ventricle cavity is normal in size. Mild concentric hypertrophy of the left ventricle. Normal global wall motion. Normal diastolic filling pattern, normal LAP. Calculated EF 55%. 2. Trace mitral regurgitation. Trace tricuspid regurgitation. Trace pulmonic regurgitation.  Assessment   Coronary artery disease of native artery of native heart with stable angina pectoris [facility]) -   04/06/2018: Very large RCA occluded in the proximal segment S/P thrombectomy followed by Georgetta Haber  3.5x22 DES. - Plan: nitroGLYCERIN (NITROSTAT) 0.4 MG SL tablet  Essential hypertension  Class 3 severe obesity due to excess calories with serious comorbidity and body mass index (BMI) of 40.0 to 44.9 in adult [facility])  Mixed hyperlipidemia  Pre-diabetes  EKG 04/13/2018: Marked has bradycardia at the rate of 48 bpm, normal axis, no evidence of ischemia, otherwise normal EKG. No significant change from EKG 03/06/2018.  Recommendations:   Presently on DAPT with Brilinta and ASA 81. Refilled his NTG.  BP controlled and we discussed weight loss and mixed hyperlipidemia and metabolic syndrome with hyperglycemia. I reviewed his labs and updated them.   I discussed weight loss and how to avoid high calorie diets. Also reduce diet [place] as well. I want to see him back in 3 months.   Adrian Prows, MD, [facility] 08/21/2018, 3:48 PM [facility]. Jud Pager: [phone] Office: [phone] If no answer Cell [phone]

## 2018-09-29 ENCOUNTER — Other Ambulatory Visit: Payer: Self-pay | Admitting: Cardiology

## 2018-11-21 ENCOUNTER — Ambulatory Visit (INDEPENDENT_AMBULATORY_CARE_PROVIDER_SITE_OTHER): Payer: BC Managed Care – PPO | Admitting: Cardiology

## 2018-11-21 ENCOUNTER — Other Ambulatory Visit: Payer: Self-pay

## 2018-11-21 ENCOUNTER — Encounter: Payer: Self-pay | Admitting: Cardiology

## 2018-11-21 VITALS — BP 117/67 | HR 58 | Temp 98.2°F | Ht 64.0 in | Wt 222.0 lb

## 2018-11-21 DIAGNOSIS — I209 Angina pectoris, unspecified: Secondary | ICD-10-CM | POA: Diagnosis not present

## 2018-11-21 DIAGNOSIS — Z6838 Body mass index (BMI) 38.0-38.9, adult: Secondary | ICD-10-CM

## 2018-11-21 DIAGNOSIS — E782 Mixed hyperlipidemia: Secondary | ICD-10-CM

## 2018-11-21 DIAGNOSIS — R0602 Shortness of breath: Secondary | ICD-10-CM

## 2018-11-21 DIAGNOSIS — I25118 Atherosclerotic heart disease of native coronary artery with other forms of angina pectoris: Secondary | ICD-10-CM

## 2018-11-21 MED ORDER — CLOPIDOGREL BISULFATE 75 MG PO TABS: 75.0000 mg | ORAL_TABLET | Freq: Every day | ORAL | 1 refills | Status: DC

## 2018-11-21 NOTE — Progress Notes (Signed)
Primary Physician/Referring:  Tereasa Coop, PA-C  Patient ID: Drew [name], male    DOB: [birth year], 63 y.o.   MRN: [id number]  Chief Complaint  Patient presents with  . Coronary Artery Disease  . Follow-up    3 month   HPI: Drew [name]  is a 63 y.o. male  Caucasian male who presents for follow-up of angina pectoris, underwent coronary angiography on 04/06/2018, had a very large occluded RCA, thrombotic occlusion, underwent thrombectomy and successful DES stenting with a 3.5 mm stent. He now presents for follow-up. PMH significant for hyperlipidemia, prediabetes, obesity.  Since angioplasty he has not had occasional angina pectoris, but has not used NTG. Had night leg cramps and still continues to have cold feet and hands.   Another concern here today was shortness of breath that is persistent and especially notices after he takes Brilinta. He also states that his wife has reminds him to mention decreased memory recently was the past 4-5 months.  Past Medical History:  Diagnosis Date  . Colon polyps 2010  . GERD (gastroesophageal reflux disease)   . HLD (hyperlipidemia)     Past Surgical History:  Procedure Laterality Date  . CATARACT EXTRACTION    . CORONARY STENT INTERVENTION N/A 04/06/2018   Procedure: CORONARY STENT INTERVENTION;  Surgeon: Adrian Prows, MD;  Location: [facility];  Service: Cardiovascular;  Laterality: N/A;  . CORONARY THROMBECTOMY N/A 04/06/2018   Procedure: Coronary Thrombectomy;  Surgeon: Adrian Prows, MD;  Location: [facility];  Service: Cardiovascular;  Laterality: N/A;  . LEFT HEART CATH AND CORONARY ANGIOGRAPHY N/A 04/06/2018   Procedure: LEFT HEART CATH AND CORONARY ANGIOGRAPHY;  Surgeon: Adrian Prows, MD;  Location: [facility];  Service: Cardiovascular;  Laterality: N/A;    Social History   Socioeconomic History  . Marital status: Married    Spouse name: Not on file  . Number of children: 2  . Years of education: Not on file   . Highest education level: Not on file  Occupational History  . Occupation: Insurance account manager  Social Needs  . Financial resource strain: Not on file  . Food insecurity    Worry: Not on file    Inability: Not on file  . Transportation needs    Medical: Not on file    Non-medical: Not on file  Tobacco Use  . Smoking status: Never Smoker  . Smokeless tobacco: Never Used  Substance and Sexual Activity  . Alcohol use: Yes    Alcohol/week: 0.0 standard drinks    Comment: 1-2 per day  . Drug use: No  . Sexual activity: Not on file  Lifestyle  . Physical activity    Days per week: Not on file    Minutes per session: Not on file  . Stress: Not on file  Relationships  . Social Herbalist on phone: Not on file    Gets together: Not on file    Attends religious service: Not on file    Active member of club or organization: Not on file    Attends meetings of clubs or organizations: Not on file    Relationship status: Not on file  . Intimate partner violence    Fear of current or ex partner: Not on file    Emotionally abused: Not on file    Physically abused: Not on file    Forced sexual activity: Not on file  Other Topics Concern  . Not on file  Social  History Narrative  . Not on file    Review of Systems  Constitution: Negative for chills, decreased appetite, malaise/fatigue and weight gain.  Cardiovascular: Positive for chest pain and dyspnea on exertion (chronic). Negative for leg swelling and syncope.  Endocrine: Negative for cold intolerance.  Hematologic/Lymphatic: Does not bruise/bleed easily.  Musculoskeletal: Negative for joint swelling.  Gastrointestinal: Negative for abdominal pain, anorexia, change in bowel habit, hematochezia and melena.  Neurological: Negative for headaches and light-headedness.  Psychiatric/Behavioral: Positive for memory loss. Negative for depression and substance abuse.  All other systems reviewed and are negative.  Objective   Blood pressure 117/67, pulse (!) 58, temperature 98.2 F (36.8 C), height 5\' 4"  (1.626 m), weight 222 lb (100.7 kg), SpO2 97 %. Body mass index is 38.11 kg/m.  Physical Exam  Constitutional: He appears well-developed. No distress.  Morbidly obese  HENT:  Head: Atraumatic.  Eyes: Conjunctivae are normal.  Neck: Neck supple. No thyromegaly present.  Short neck and difficult to evaluate JVP  Cardiovascular: Normal rate, regular rhythm, normal heart sounds and intact distal pulses. Exam reveals no gallop.  No murmur heard. Femoral and popliteal pulse difficult to feel due to patient's body habitus. Normal pedal pulse.  Trace bilateral ankle edema.   Pulmonary/Chest: Effort normal and breath sounds normal.  Abdominal: Soft. Bowel sounds are normal.  Obese. Pannus present  Musculoskeletal: Normal range of motion.        General: No edema.  Neurological: He is alert.  Skin: Skin is warm and dry.  Psychiatric: He has a normal mood and affect.   Radiology: No results found.  Laboratory examination:    CMP Latest Ref Rng & Units 08/14/2018 05/04/2018 03/06/2018  Glucose 65 - 99 mg/dL 106(H) 98 100(H)  BUN 8 - 27 mg/dL 20 24 16   Creatinine 0.76 - 1.27 mg/dL 1.23 1.13 1.00  Sodium 134 - 144 mmol/L 138 142 138  Potassium 3.5 - 5.2 mmol/L 4.3 4.8 4.2  Chloride 96 - 106 mmol/L 100 103 103  CO2 20 - 29 mmol/L 25 25 -  Calcium 8.6 - 10.2 mg/dL 9.4 9.5 -  Total Protein 6.0 - 8.5 g/dL 6.8 - -  Total Bilirubin 0.0 - 1.2 mg/dL 0.6 - -  Alkaline Phos 39 - 117 IU/L 78 - -  AST 0 - 40 IU/L 27 - -  ALT 0 - 44 IU/L 33 - -   CBC Latest Ref Rng & Units 08/14/2018 03/06/2018 05/27/2017  WBC 3.4 - 10.8 x10E3/uL 7.1 - 7.3  Hemoglobin 13.0 - 17.7 g/dL 14.0 15.0 14.6  Hematocrit 37.5 - 51.0 % 39.9 44.0 43.3  Platelets 150 - 450 x10E3/uL 166 - 185   Lipid Panel     Component Value Date/Time   CHOL 153 08/14/2018 1056   TRIG 197 (H) 08/14/2018 1056   HDL 39 (L) 08/14/2018 1056   CHOLHDL 4.3  05/27/2017 1511   CHOLHDL 7.5 (H) 04/20/2015 1607   VLDL 70 (H) 04/20/2015 1607   LDLCALC 75 08/14/2018 1056   HEMOGLOBIN A1C Lab Results  Component Value Date   HGBA1C 5.9 (H) 05/27/2017   TSH No results for input(s): TSH in the last 8760 hours.  PRN Meds:. Medications Discontinued During This Encounter  Medication Reason  . ticagrelor (BRILINTA) 90 MG TABS tablet Discontinued by provider   Current Meds  Medication Sig  . acetaminophen (TYLENOL) 650 MG CR tablet Take 650 mg by mouth every 8 (eight) hours as needed for pain.  Marland Kitchen amLODipine (NORVASC) 5  MG tablet Take 5 mg by mouth daily.  Marland Kitchen aspirin EC 81 MG tablet Take 81 mg by mouth daily.  . famotidine (PEPCID) 20 MG tablet TAKE 1 TO 2 TABLETS BY MOUTH ONCE DAILY AS NEEDED FOR HEARTBURN OR INDIGESTION  . HYDROcodone-acetaminophen (NORCO) 5-325 MG tablet Take 1 tablet by mouth daily as needed for severe pain (For severe pain only.). For severe pain only. (Patient taking differently: Take 0.5-1 tablets by mouth daily as needed for severe pain (For severe pain only.). For severe pain only.)  . metoprolol tartrate (LOPRESSOR) 25 MG tablet Take 25 mg by mouth 2 (two) times daily.  . nitroGLYCERIN (NITROSTAT) 0.4 MG SL tablet Place 1 tablet (0.4 mg total) under the tongue every 5 (five) minutes x 3 doses as needed for chest pain.  Marland Kitchen olmesartan-hydrochlorothiazide (BENICAR HCT) 20-12.5 MG tablet TAKE 1 TABLET BY MOUTH ONCE DAILY IN THE MORNING  . rosuvastatin (CRESTOR) 20 MG tablet Take 10 mg by mouth daily.  . [DISCONTINUED] ticagrelor (BRILINTA) 90 MG TABS tablet Take 1 tablet (90 mg total) by mouth 2 (two) times daily.    Cardiac Studies:   Coronary angiogram 04/06/2018: Very large RCA occluded in the proximal segment S/P thrombectomy followed by Orsiro 3.5x22 DES. Normal LVEF, mild disease in other vessels.  Echocardiogram 03/12/2018: 1. Left ventricle cavity is normal in size. Mild concentric hypertrophy of the left ventricle.  Normal global wall motion. Normal diastolic filling pattern, normal LAP. Calculated EF 55%. 2. Trace mitral regurgitation. Trace tricuspid regurgitation. Trace pulmonic regurgitation.  Assessment     ICD-10-CM   1. Coronary artery disease of native artery of native heart with stable angina pectoris ([place])  I25.118 EKG 12-Lead    clopidogrel (PLAVIX) 75 MG tablet   04/06/2018: Thrombectomy followed by Orsiro 3.5x22 DES to prox RCA.  2. Angina pectoris ([place])  I20.9 EKG 12-Lead  3. Shortness of breath  R06.02   4. Mixed hyperlipidemia  E78.2   5. Class 2 severe obesity due to excess calories with serious comorbidity and body mass index (BMI) of 38.0 to 38.9 in adult [facility])  E66.01    Z68.38     EKG 11/21/2018: Sinus bradycardia at rate of 59 bpm, normal axis.  No evidence of ischemia, normal EKG.  Compared to EKG 04/13/2018: Marked has bradycardia at the rate of 48 bpm.  Recommendations:   Patient is here on a three-month office visit and follow-up of coronary artery disease, he still continues to have dyspnea on exertion and notices more dyspnea with taking Brilinta.  I'll discontinue this and switching to Plavix.  We discussed weight loss and mixed hyperlipidemia and metabolic syndrome with hyperglycemia. I reviewed his labs and updated them. He'll be considered morbidly obese in view of hyperglycemia and underlying cardiac disease.  Again encouraged him small portions.  With regard to short-term memory loss, which is wife has noticed but not noticed by the patient himself, advised him that he could hold off on Crestor for one month and if he notices improvement to call fax a week and switching from Crestor to different agent however if there is no changes should continue Crestor for cardiovascular protection.  I also encouraged him to set up an appointment to see a neurologist.  Adrian Prows, MD, [facility] 11/21/2018, 5:11 PM [facility] [place] Pager: [phone] Office: [phone] If  no answer Cell [phone]

## 2018-11-23 NOTE — Telephone Encounter (Signed)
From pt

## 2019-02-11 ENCOUNTER — Encounter (INDEPENDENT_AMBULATORY_CARE_PROVIDER_SITE_OTHER): Payer: BC Managed Care – PPO | Admitting: Ophthalmology

## 2019-02-11 ENCOUNTER — Other Ambulatory Visit: Payer: Self-pay

## 2019-02-11 DIAGNOSIS — H35371 Puckering of macula, right eye: Secondary | ICD-10-CM

## 2019-02-11 DIAGNOSIS — H33301 Unspecified retinal break, right eye: Secondary | ICD-10-CM | POA: Diagnosis not present

## 2019-02-11 DIAGNOSIS — H43813 Vitreous degeneration, bilateral: Secondary | ICD-10-CM

## 2019-04-25 ENCOUNTER — Ambulatory Visit: Payer: BC Managed Care – PPO

## 2019-05-03 ENCOUNTER — Ambulatory Visit: Payer: BC Managed Care – PPO | Attending: Internal Medicine

## 2019-05-03 DIAGNOSIS — Z23 Encounter for immunization: Secondary | ICD-10-CM | POA: Insufficient documentation

## 2019-05-03 NOTE — Progress Notes (Signed)
   Covid-19 Vaccination Clinic  Name:  Arvil Kmiecik    MRN: [id number] DOB: March 21, 1956  05/03/2019  Mr. Casaus was observed post Covid-19 immunization for 15 minutes without incidence. He was provided with Vaccine Information Sheet and instruction to access the V-Safe system.   Mr. Lala was instructed to call 911 with any severe reactions post vaccine: Marland Kitchen Difficulty breathing  . Swelling of your face and throat  . A fast heartbeat  . A bad rash all over your body  . Dizziness and weakness    Immunizations Administered    Name Date Dose VIS Date Route   Pfizer COVID-19 Vaccine 05/03/2019  4:10 PM 0.3 mL 03/08/2019 Intramuscular   Manufacturer: [place]   Lot: [id number]   [place]: [id number]

## 2019-05-07 ENCOUNTER — Other Ambulatory Visit: Payer: Self-pay | Admitting: Cardiology

## 2019-05-07 DIAGNOSIS — I25118 Atherosclerotic heart disease of native coronary artery with other forms of angina pectoris: Secondary | ICD-10-CM

## 2019-05-08 NOTE — Telephone Encounter (Signed)
refill 

## 2019-05-16 ENCOUNTER — Ambulatory Visit: Payer: BC Managed Care – PPO

## 2019-05-28 ENCOUNTER — Ambulatory Visit: Payer: BC Managed Care – PPO | Attending: Internal Medicine

## 2019-05-28 DIAGNOSIS — Z23 Encounter for immunization: Secondary | ICD-10-CM | POA: Insufficient documentation

## 2019-05-28 NOTE — Progress Notes (Signed)
   Covid-19 Vaccination Clinic  Name:  Drew [name]    MRN: [id number] DOB: 16-May-1955  05/28/2019  [name] was observed post Covid-19 immunization for 15 minutes without incident. He was provided with Vaccine Information Sheet and instruction to access the V-Safe system.   [name] was instructed to call 911 with any severe reactions post vaccine: Marland Kitchen Difficulty breathing  . Swelling of face and throat  . A fast heartbeat  . A bad rash all over body  . Dizziness and weakness   Immunizations Administered    Name Date Dose VIS Date Route   Pfizer COVID-19 Vaccine 05/28/2019  3:50 PM 0.3 mL 03/08/2019 Intramuscular   Manufacturer: [place]   Lot: [id number]   [place]: [id number]

## 2019-06-06 ENCOUNTER — Other Ambulatory Visit: Payer: Self-pay

## 2019-06-06 ENCOUNTER — Encounter: Payer: Self-pay | Admitting: Cardiology

## 2019-06-06 ENCOUNTER — Ambulatory Visit: Payer: BC Managed Care – PPO | Admitting: Cardiology

## 2019-06-06 VITALS — BP 112/77 | HR 74 | Temp 97.8°F | Resp 16 | Ht 64.0 in | Wt 228.0 lb

## 2019-06-06 DIAGNOSIS — E782 Mixed hyperlipidemia: Secondary | ICD-10-CM

## 2019-06-06 DIAGNOSIS — I10 Essential (primary) hypertension: Secondary | ICD-10-CM

## 2019-06-06 DIAGNOSIS — I2510 Atherosclerotic heart disease of native coronary artery without angina pectoris: Secondary | ICD-10-CM

## 2019-06-06 DIAGNOSIS — R0602 Shortness of breath: Secondary | ICD-10-CM

## 2019-06-06 NOTE — Patient Instructions (Signed)
Labs in June first week.

## 2019-06-06 NOTE — Progress Notes (Signed)
Primary Physician/Referring:  Tereasa Coop, PA-C  Patient ID: Drew [name], male    DOB: June 01, 1955, 64 y.o.   MRN: [id number]  Chief Complaint  Patient presents with  . Coronary Artery Disease  . Dizziness  . Follow-up    6 Month   HPI:    Drew [name]  is a 64 y.o. Caucasian male who presents for follow-up of angina pectoris, underwent coronary angiography on 04/06/2018, had a very large occluded RCA, thrombotic occlusion, underwent thrombectomy and successful DES stenting with a 3.5 mm stent.  He now presents for follow-up. PMH significant for  hyperlipidemia, prediabetes, obesity.   Since last office visit he has not had angina pectoris, has not used NTG. Has occasional night leg cramps. On his last office visit 6 months ago, I discontinued Brilinta due to dyspnea.  Is presently on Plavix.  Also recommended holding Crestor for a few weeks to see if mental status changes he has noticed would improve as his wife had noticed forgetfulness.  He now presents for follow-up.  States that he has not discontinued Crestor, he has not noticed any mental status changes or memory changes, he continues to teach as a professor.  Dyspnea has remained stable.  Admits to having gained weight.  Past Medical History:  Diagnosis Date  . Colon polyps 2010  . GERD (gastroesophageal reflux disease)   . HLD (hyperlipidemia)    Past Surgical History:  Procedure Laterality Date  . CATARACT EXTRACTION    . CORONARY STENT INTERVENTION N/A 04/06/2018   Procedure: CORONARY STENT INTERVENTION;  Surgeon: Adrian Prows, MD;  Location: [facility];  Service: Cardiovascular;  Laterality: N/A;  . CORONARY THROMBECTOMY N/A 04/06/2018   Procedure: Coronary Thrombectomy;  Surgeon: Adrian Prows, MD;  Location: [facility];  Service: Cardiovascular;  Laterality: N/A;  . LEFT HEART CATH AND CORONARY ANGIOGRAPHY N/A 04/06/2018   Procedure: LEFT HEART CATH AND CORONARY ANGIOGRAPHY;  Surgeon: Adrian Prows, MD;   Location: [facility];  Service: Cardiovascular;  Laterality: N/A;   Family History  Problem Relation Age of Onset  . Diabetes Sister   . Heart disease Father   . Brain cancer Mother        chemical exposure    Social History   Tobacco Use  . Smoking status: Never Smoker  . Smokeless tobacco: Never Used  Substance Use Topics  . Alcohol use: Yes    Alcohol/week: 0.0 standard drinks    Comment: 1-2 per day   ROS  Review of Systems  Cardiovascular: Positive for dyspnea on exertion. Negative for chest pain and leg swelling.  Gastrointestinal: Negative for melena.   Objective  Blood pressure 112/77, pulse 74, temperature 97.8 F (36.6 C), temperature source Temporal, resp. rate 16, height 5\' 4"  (1.626 m), weight 228 lb (103.4 kg), SpO2 98 %.  Vitals with BMI 06/06/2019 11/21/2018 08/21/2018  Height 5\' 4"  5\' 4"  5' 4.5"  Weight 228 lbs 222 lbs 217 lbs  BMI 39.12 [id number] [id number]  Systolic [id number] 99  Diastolic 77 67 72  Pulse 74 58 54     Physical Exam  Constitutional: He appears well-developed. No distress.  Morbidly obese  Neck:  Short neck and difficult to evaluate JVP  Cardiovascular: Normal rate, regular rhythm, normal heart sounds and intact distal pulses. Exam reveals no gallop.  No murmur heard. Femoral and popliteal pulse difficult to feel due to patient's body habitus. Normal pedal pulse.  Trace bilateral ankle edema.  No  JVD.   Pulmonary/Chest: Effort normal and breath sounds normal.  Abdominal: Soft. Bowel sounds are normal.  Obese. Pannus present   Laboratory examination:   Recent Labs    08/14/18 1056  NA 138  K 4.3  CL 100  CO2 25  GLUCOSE 106*  BUN 20  CREATININE 1.23  CALCIUM 9.4  GFRNONAA 62  GFRAA 71   CrCl cannot be calculated (Patient's most recent lab result is older than the maximum 21 days allowed.).  CMP Latest Ref Rng & Units 08/14/2018 05/04/2018 03/06/2018  Glucose 65 - 99 mg/dL 106(H) 98 100(H)  BUN 8 - 27 mg/dL 20 24 16     Creatinine 0.76 - 1.27 mg/dL 1.23 1.13 1.00  Sodium 134 - 144 mmol/L 138 142 138  Potassium 3.5 - 5.2 mmol/L 4.3 4.8 4.2  Chloride 96 - 106 mmol/L 100 103 103  CO2 20 - 29 mmol/L 25 25 -  Calcium 8.6 - 10.2 mg/dL 9.4 9.5 -  Total Protein 6.0 - 8.5 g/dL 6.8 - -  Total Bilirubin 0.0 - 1.2 mg/dL 0.6 - -  Alkaline Phos 39 - 117 IU/L 78 - -  AST 0 - 40 IU/L 27 - -  ALT 0 - 44 IU/L 33 - -   CBC Latest Ref Rng & Units 08/14/2018 03/06/2018 05/27/2017  WBC 3.4 - 10.8 x10E3/uL 7.1 - 7.3  Hemoglobin 13.0 - 17.7 g/dL 14.0 15.0 14.6  Hematocrit 37.5 - 51.0 % 39.9 44.0 43.3  Platelets 150 - 450 x10E3/uL 166 - 185   Lipid Panel     Component Value Date/Time   CHOL 153 08/14/2018 1056   TRIG 197 (H) 08/14/2018 1056   HDL 39 (L) 08/14/2018 1056   CHOLHDL 4.3 05/27/2017 1511   CHOLHDL 7.5 (H) 04/20/2015 1607   VLDL 70 (H) 04/20/2015 1607   LDLCALC 75 08/14/2018 1056   HEMOGLOBIN A1C Lab Results  Component Value Date   HGBA1C 5.9 (H) 05/27/2017   TSH No results for input(s): TSH in the last 8760 hours.  Medications and allergies   Allergies  Allergen Reactions  . Scallops [Shellfish Allergy]     Vomiting      Current Outpatient Medications  Medication Instructions  . amLODipine (NORVASC) 5 mg, Oral, Daily  . aspirin EC 81 mg, Oral, Daily  . famotidine (PEPCID) 20 MG tablet TAKE 1 TO 2 TABLETS BY MOUTH ONCE DAILY AS NEEDED FOR HEARTBURN OR INDIGESTION  . metoprolol tartrate (LOPRESSOR) 25 mg, Oral, 2 times daily  . nitroGLYCERIN (NITROSTAT) 0.4 mg, Sublingual, Every 5 min x3 PRN  . olmesartan-hydrochlorothiazide (BENICAR HCT) 20-12.5 MG tablet TAKE 1 TABLET BY MOUTH ONCE DAILY IN THE MORNING  . rosuvastatin (CRESTOR) 10 mg, Oral, Daily   Radiology:   No results found.  Cardiac Studies:   Coronary angiogram 04/06/2018: Very large RCA occluded in the proximal segment S/P thrombectomy followed by Orsiro 3.5x22 DES. Normal LVEF, mild disease in other vessels.  Echocardiogram  03/12/2018: 1. Left ventricle cavity is normal in size. Mild concentric hypertrophy of the left ventricle. Normal global wall motion. Normal diastolic filling pattern, normal LAP. Calculated EF 55%. 2. Trace mitral regurgitation. Trace tricuspid regurgitation. Trace pulmonic regurgitation.  EKG  EKG 06/06/2019: Normal sinus rhythm with rate of 66 bpm, normal axis.  Poor R wave progression, probably normal variant.  No evidence of ischemia.   No significant change from EKG 11/21/2018  Assessment     ICD-10-CM   1. Coronary artery disease involving native coronary artery of  native heart without angina pectoris  I25.10 EKG 12-Lead  2. Shortness of breath  R06.02   3. Mixed hyperlipidemia  E78.2 Lipid Panel With LDL/HDL Ratio    LDL cholesterol, direct    LDL cholesterol, direct    Lipid Panel With LDL/HDL Ratio  4. Essential hypertension  I10      No orders of the defined types were placed in this encounter.   Medications Discontinued During This Encounter  Medication Reason  . acetaminophen (TYLENOL) 650 MG CR tablet No longer needed (for PRN medications)  . HYDROcodone-acetaminophen (NORCO) 5-325 MG tablet Patient Preference  . BRILINTA 90 MG TABS tablet Side effect (s)  . clopidogrel (PLAVIX) 75 MG tablet Completed Course    Recommendations:   Drew [name]  is a 64 y.o. Caucasian male who presents for follow-up of angina pectoris, underwent coronary angiography on 04/06/2018, had a very large occluded RCA, thrombotic occlusion, underwent thrombectomy and successful DES stenting with a 3.5 mm stent.  He now presents for follow-up. PMH significant for  hyperlipidemia, prediabetes, obesity.   This is a 46-month office visit, he has gained weight but fortunately no clinical evidence heart failure, he has not had any recurrence of angina pectoris.    I reviewed his external labs, triglycerides are markedly elevated.  LDL is marginally on the upper limit of normal for known coronary  disease at 35.  I discussed these issues which is directly related to his calorie intake and his diet.  Patient would like to make changes to his lifestyle and would like to see me back in 3 months prior to starting therapy for the same, otherwise adding Vascepa and also Zetia would be appropriate.  I would like to see him back in 3 months.  Blood pressures well controlled, he is on appropriate medical therapy otherwise.  As it has been a year since his angioplasty, will discontinue Plavix and continue aspirin alone.  His dyspnea is related to obesity hypoventilation.  Adrian Prows, MD, [facility] 06/06/2019, 4:58 PM [facility]. [place] Office: [phone]

## 2019-07-15 ENCOUNTER — Other Ambulatory Visit: Payer: Self-pay

## 2019-07-15 MED ORDER — METOPROLOL TARTRATE 25 MG PO TABS: 12.5000 mg | ORAL_TABLET | Freq: Every day | ORAL | 0 refills | Status: DC

## 2019-07-23 ENCOUNTER — Other Ambulatory Visit: Payer: Self-pay

## 2019-07-23 MED ORDER — METOPROLOL SUCCINATE ER 25 MG PO TB24: 25.0000 mg | ORAL_TABLET | Freq: Every day | ORAL | 0 refills | Status: DC

## 2019-08-30 LAB — LIPID PANEL WITH LDL/HDL RATIO
Cholesterol, Total: 163 mg/dL (ref 100–199)
HDL: 40 mg/dL (ref >39)
LDL Chol Calc (NIH): 94 mg/dL (ref 0–99)
LDL/HDL Ratio: 2.4 ratio (ref 0.0–3.6)
Triglycerides: 169 mg/dL — ABNORMAL HIGH (ref 0–149)
VLDL Cholesterol Cal: 29 mg/dL (ref 5–40)

## 2019-08-30 LAB — LDL CHOLESTEROL, DIRECT: LDL Direct: 94 mg/dL (ref 0–99)

## 2019-09-09 ENCOUNTER — Ambulatory Visit: Payer: BC Managed Care – PPO | Admitting: Cardiology

## 2019-09-09 ENCOUNTER — Encounter: Payer: Self-pay | Admitting: Cardiology

## 2019-09-09 ENCOUNTER — Other Ambulatory Visit: Payer: Self-pay

## 2019-09-09 VITALS — BP 105/68 | HR 58 | Resp 17 | Ht 64.0 in | Wt 218.0 lb

## 2019-09-09 DIAGNOSIS — I10 Essential (primary) hypertension: Secondary | ICD-10-CM

## 2019-09-09 DIAGNOSIS — E782 Mixed hyperlipidemia: Secondary | ICD-10-CM

## 2019-09-09 DIAGNOSIS — I25118 Atherosclerotic heart disease of native coronary artery with other forms of angina pectoris: Secondary | ICD-10-CM

## 2019-09-09 MED ORDER — EZETIMIBE 10 MG PO TABS: 10.0000 mg | ORAL_TABLET | Freq: Every day | ORAL | 3 refills | Status: DC

## 2019-09-09 NOTE — Progress Notes (Signed)
Primary Physician/Referring:  Tereasa Coop, PA-C  Patient ID: Drew [name], male    DOB: 1955/05/09, 64 y.o.   MRN: [id number]  Chief Complaint  Patient presents with  . Coronary Artery Disease  . Hyperlipidemia  . Follow-up    3 MONTH   HPI:    Drew [name]  is a 64 y.o. Caucasian male who presents for follow-up of angina pectoris, underwent coronary angiography on 04/06/2018, had a very large occluded RCA, thrombotic occlusion, underwent thrombectomy and successful DES stenting with a 3.5 mm stent.  He now presents for follow-up. PMH significant for  hyperlipidemia, prediabetes, obesity.  He presents for follow-up of coronary artery disease, hypertension and specifically hyperlipidemia.  On his last office visit he had gained weight, his LDL was not at goal.  He underwent repeat lipid profile testing and presents for follow-up.  Past Medical History:  Diagnosis Date  . Colon polyps 2010  . GERD (gastroesophageal reflux disease)   . HLD (hyperlipidemia)   . Hypertension    Past Surgical History:  Procedure Laterality Date  . CATARACT EXTRACTION    . CORONARY STENT INTERVENTION N/A 04/06/2018   Procedure: CORONARY STENT INTERVENTION;  Surgeon: Adrian Prows, MD;  Location: [facility];  Service: Cardiovascular;  Laterality: N/A;  . CORONARY THROMBECTOMY N/A 04/06/2018   Procedure: Coronary Thrombectomy;  Surgeon: Adrian Prows, MD;  Location: [facility];  Service: Cardiovascular;  Laterality: N/A;  . LEFT HEART CATH AND CORONARY ANGIOGRAPHY N/A 04/06/2018   Procedure: LEFT HEART CATH AND CORONARY ANGIOGRAPHY;  Surgeon: Adrian Prows, MD;  Location: [facility];  Service: Cardiovascular;  Laterality: N/A;   Family History  Problem Relation Age of Onset  . Diabetes Sister   . Heart disease Father   . Brain cancer Mother        chemical exposure    Social History   Tobacco Use  . Smoking status: Never Smoker  . Smokeless tobacco: Never Used  Substance Use  Topics  . Alcohol use: Yes    Alcohol/week: 0.0 standard drinks    Comment: 1-2 per day   ROS  Review of Systems  Cardiovascular: Positive for dyspnea on exertion. Negative for chest pain and leg swelling.  Musculoskeletal: Positive for back pain.  Gastrointestinal: Negative for melena.   Objective  Blood pressure 105/68, pulse (!) 58, resp. rate 17, height 5\' 4"  (1.626 m), weight 218 lb (98.9 kg), SpO2 97 %.  Vitals with BMI 09/09/2019 06/06/2019 11/21/2018  Height 5\' 4"  5\' 4"  5\' 4"   Weight 218 lbs 228 lbs 222 lbs  BMI 37.4 43.56 86.16  Systolic 837 290 211  Diastolic 68 77 67  Pulse 58 74 58     Physical Exam Constitutional:      Appearance: He is well-developed.     Comments: Morbidly obese  Neck:     Comments: Short neck and difficult to evaluate J[facility] Cardiovascular:     Rate and Rhythm: Normal rate and regular rhythm.     Pulses: Intact distal pulses.          Dorsalis pedis pulses are 2+ on the right side.       Posterior tibial pulses are 2+ on the right side.     Heart sounds: Normal heart sounds. No murmur heard.  No gallop.      Comments: Femoral and popliteal pulse difficult to feel due to patient's body habitus. Normal pedal pulse.  Bilateral onychomycosis noted in toe nails. Trace  bilateral ankle edema.  No JVD.  Pulmonary:     Effort: Pulmonary effort is normal.     Breath sounds: Normal breath sounds.  Abdominal:     General: Bowel sounds are normal.     Palpations: Abdomen is soft.     Comments: Obese. Pannus present    Laboratory examination:   No results for input(s): NA, K, CL, CO2, GLUCOSE, BUN, CREATININE, CALCIUM, GFRNONAA, GFRAA in the last 8760 hours. CrCl cannot be calculated (Patient's most recent lab result is older than the maximum 21 days allowed.).  CMP Latest Ref Rng & Units 08/14/2018 05/04/2018 03/06/2018  Glucose 65 - 99 mg/dL 106(H) 98 100(H)  BUN 8 - 27 mg/dL 20 24 16   Creatinine 0.76 - 1.27 mg/dL 1.23 1.13 1.00  Sodium 134 - 144  mmol/L 138 142 138  Potassium 3.5 - 5.2 mmol/L 4.3 4.8 4.2  Chloride 96 - 106 mmol/L 100 103 103  CO2 20 - 29 mmol/L 25 25 -  Calcium 8.6 - 10.2 mg/dL 9.4 9.5 -  Total Protein 6.0 - 8.5 g/dL 6.8 - -  Total Bilirubin 0.0 - 1.2 mg/dL 0.6 - -  Alkaline Phos 39 - 117 IU/L 78 - -  AST 0 - 40 IU/L 27 - -  ALT 0 - 44 IU/L 33 - -   CBC Latest Ref Rng & Units 08/14/2018 03/06/2018 05/27/2017  WBC 3.4 - 10.8 x10E3/uL 7.1 - 7.3  Hemoglobin 13.0 - 17.7 g/dL 14.0 15.0 14.6  Hematocrit 37.5 - 51.0 % 39.9 44.0 43.3  Platelets 150 - 450 x10E3/uL 166 - 185   Lipid Panel     Component Value Date/Time   CHOL 163 08/29/2019 1122   TRIG 169 (H) 08/29/2019 1122   HDL 40 08/29/2019 1122   CHOLHDL 4.3 05/27/2017 1511   CHOLHDL 7.5 (H) 04/20/2015 1607   VLDL 70 (H) 04/20/2015 1607   LDLCALC 94 08/29/2019 1122   LDLDIRECT 94 08/29/2019 1121   HEMOGLOBIN A1C Lab Results  Component Value Date   HGBA1C 5.9 (H) 05/27/2017   TSH No results for input(s): TSH in the last 8760 hours.  Medications and allergies   Allergies  Allergen Reactions  . Scallops [Shellfish Allergy]     Vomiting      Current Outpatient Medications  Medication Instructions  . aspirin EC 81 mg, Oral, Daily  . ezetimibe (ZETIA) 10 mg, Oral, Daily after supper  . famotidine (PEPCID) 20 MG tablet TAKE 1 TO 2 TABLETS BY MOUTH ONCE DAILY AS NEEDED FOR HEARTBURN OR INDIGESTION  . HYDROcodone-acetaminophen (NORCO/VICODIN) 5-325 MG tablet 1 tablet, Oral, Every 6 hours PRN  . metoprolol succinate (TOPROL-XL) 25 mg, Oral, Daily  . nitroGLYCERIN (NITROSTAT) 0.4 mg, Sublingual, Every 5 min x3 PRN  . olmesartan-hydrochlorothiazide (BENICAR HCT) 20-12.5 MG tablet TAKE 1 TABLET BY MOUTH ONCE DAILY IN THE MORNING  . rosuvastatin (CRESTOR) 10 mg, Oral, Daily   Radiology:   No results found.  Cardiac Studies:   Coronary angiogram 04/06/2018: Very large RCA occluded in the proximal segment S/P thrombectomy followed by Orsiro 3.5x22 DES.  Normal LVEF, mild disease in other vessels.  Echocardiogram 03/12/2018: 1. Left ventricle cavity is normal in size. Mild concentric hypertrophy of the left ventricle. Normal global wall motion. Normal diastolic filling pattern, normal LAP. Calculated EF 55%. 2. Trace mitral regurgitation. Trace tricuspid regurgitation. Trace pulmonic regurgitation.  EKG  EKG 06/06/2019: Normal sinus rhythm with rate of 66 bpm, normal axis.  Poor R wave progression, probably normal variant.  No evidence of ischemia.   No significant change from EKG 11/21/2018  Assessment     ICD-10-CM   1. Coronary artery disease of native artery of native heart with stable angina pectoris (HCC)  I25.118 ezetimibe (ZETIA) 10 MG tablet  2. Essential hypertension  I10   3. Mixed hyperlipidemia  E78.2 ezetimibe (ZETIA) 10 MG tablet    Lipid Panel With LDL/HDL Ratio    Lipid Panel With LDL/HDL Ratio     Meds ordered this encounter  Medications  . ezetimibe (ZETIA) 10 MG tablet    Sig: Take 1 tablet (10 mg total) by mouth daily after supper.    Dispense:  90 tablet    Refill:  3    Medications Discontinued During This Encounter  Medication Reason  . amLODipine (NORVASC) 5 MG tablet Discontinued by provider    Recommendations:   Kellie Chisolm  is a 64 y.o. Caucasian male who presents for follow-up of angina pectoris, underwent coronary angiography on 04/06/2018, had a very large occluded RCA, thrombotic occlusion, underwent thrombectomy and successful DES stenting with a 3.5 mm stent.  He now presents for follow-up. PMH significant for  hyperlipidemia, prediabetes, obesity.   I had seen him 3 months ago, his LDL is still not at target, he was willing to make lifestyle changes he now presents for follow-up.  I again reviewed the results of recently performed labs, LDL is not at goal.  As discussed previously I will add Zetia 10 mg daily.  Importance of making lifestyle changes especially with regard to calorie restriction  was discussed in detail.  He has not had any angina pectoris fortunately, on his last office visit I discontinued Plavix, is presently on aspirin alone.  Blood pressure is well controlled.  I will repeat lipid profile testing in 6 months and see him back at that time.   Adrian Prows, MD, [facility] 09/09/2019, 1:22 PM [facility] [place] Office: [phone]

## 2019-12-23 ENCOUNTER — Other Ambulatory Visit: Payer: Self-pay

## 2019-12-23 MED ORDER — METOPROLOL SUCCINATE ER 25 MG PO TB24: 25.0000 mg | ORAL_TABLET | Freq: Every day | ORAL | 1 refills | Status: DC

## 2020-02-10 ENCOUNTER — Encounter (INDEPENDENT_AMBULATORY_CARE_PROVIDER_SITE_OTHER): Payer: BC Managed Care – PPO | Admitting: Ophthalmology

## 2020-02-10 ENCOUNTER — Other Ambulatory Visit: Payer: Self-pay

## 2020-02-10 DIAGNOSIS — H35033 Hypertensive retinopathy, bilateral: Secondary | ICD-10-CM | POA: Diagnosis not present

## 2020-02-10 DIAGNOSIS — I10 Essential (primary) hypertension: Secondary | ICD-10-CM | POA: Diagnosis not present

## 2020-02-10 DIAGNOSIS — H35371 Puckering of macula, right eye: Secondary | ICD-10-CM

## 2020-02-10 DIAGNOSIS — H43813 Vitreous degeneration, bilateral: Secondary | ICD-10-CM

## 2020-02-10 DIAGNOSIS — H33301 Unspecified retinal break, right eye: Secondary | ICD-10-CM | POA: Diagnosis not present

## 2020-03-13 LAB — LIPID PANEL WITH LDL/HDL RATIO
Cholesterol, Total: 161 mg/dL (ref 100–199)
HDL: 39 mg/dL — ABNORMAL LOW (ref >39)
LDL Chol Calc (NIH): 81 mg/dL (ref 0–99)
LDL/HDL Ratio: 2.1 ratio (ref 0.0–3.6)
Triglycerides: 247 mg/dL — ABNORMAL HIGH (ref 0–149)
VLDL Cholesterol Cal: 41 mg/dL — ABNORMAL HIGH (ref 5–40)

## 2020-03-13 IMAGING — DX DG CHEST 2V
2 series · 2 of 2 positions shown · non-contrast
Comparison: None.

CLINICAL DATA: Chest pain and shortness of breath with exertion,
some wheezing over the last 3 months

EXAM:
CHEST - 2 VIEW

[chest pa]
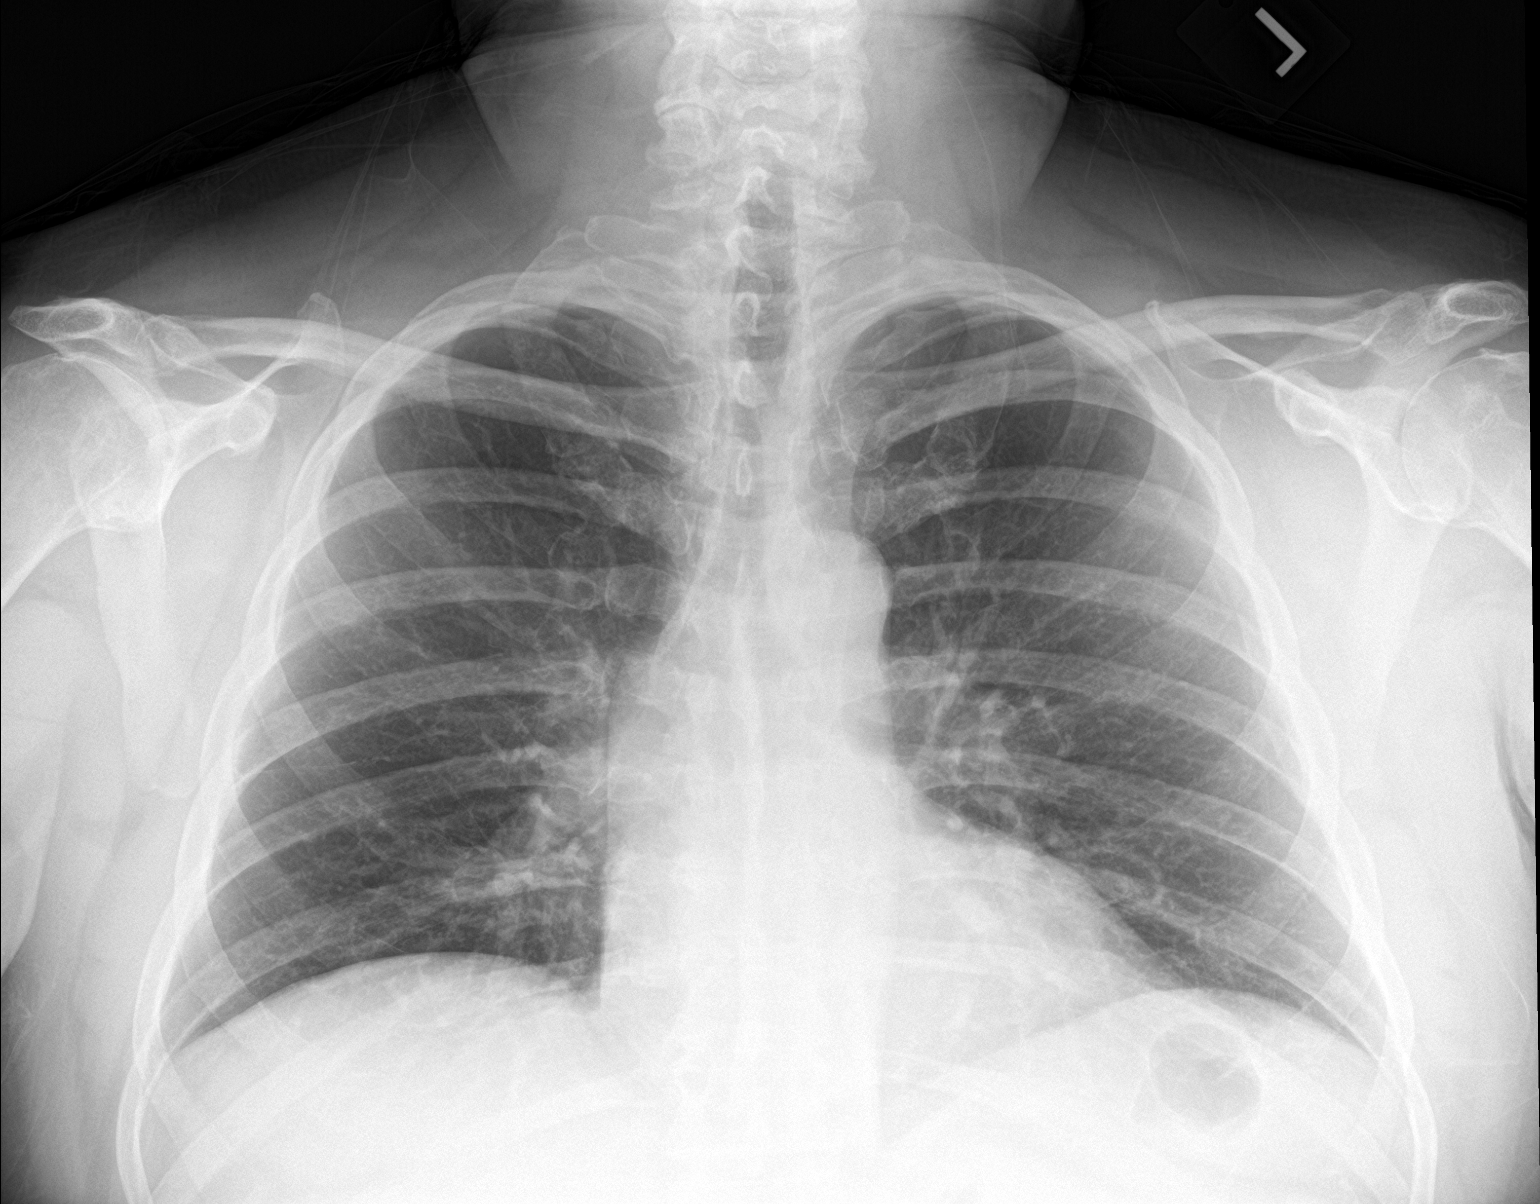

[chest lat]
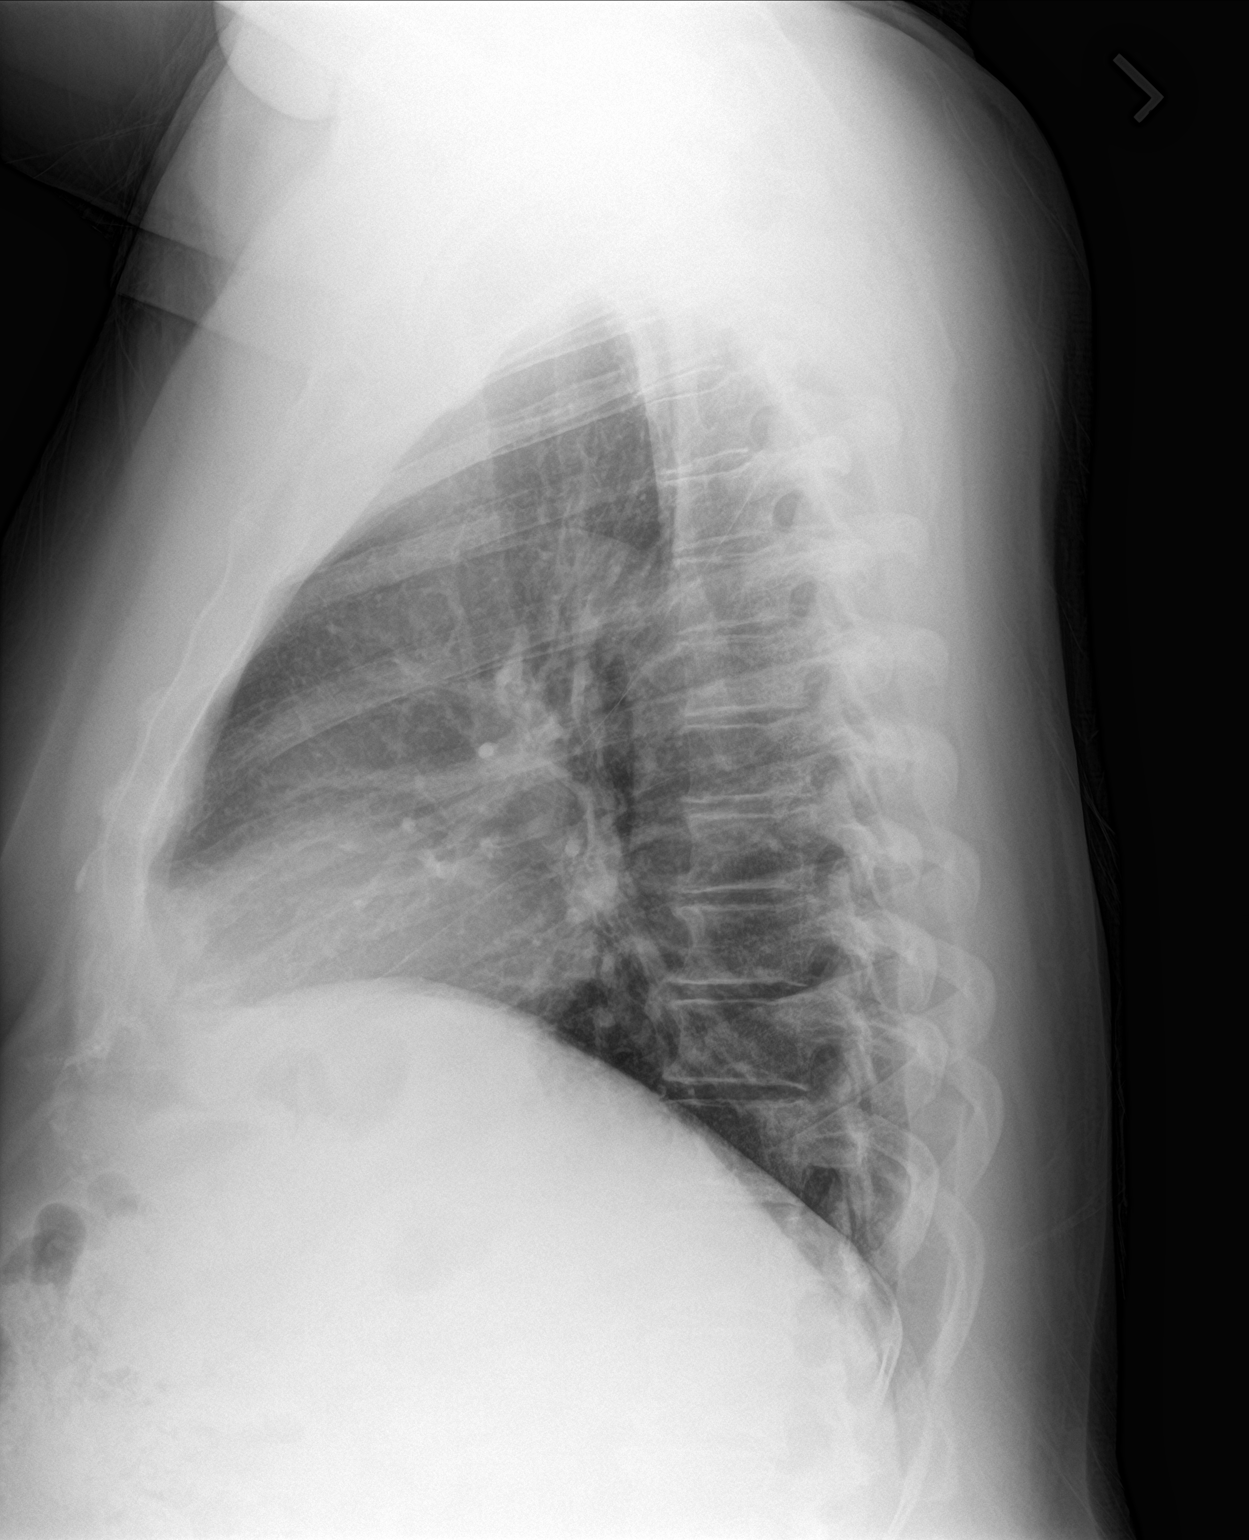

[2 of 2 positions shown; findings below may reference images not displayed]

FINDINGS: No active infiltrate or effusion is seen. Mediastinal and hilar
contours are unremarkable. The heart is within normal limits in
size. No acute bony abnormality is seen.
IMPRESSION: No active cardiopulmonary disease.

## 2020-03-16 ENCOUNTER — Other Ambulatory Visit: Payer: Self-pay

## 2020-03-16 ENCOUNTER — Ambulatory Visit: Payer: BC Managed Care – PPO | Admitting: Cardiology

## 2020-03-16 ENCOUNTER — Encounter: Payer: Self-pay | Admitting: Cardiology

## 2020-03-16 VITALS — BP 121/73 | HR 64 | Resp 16 | Ht 64.0 in | Wt 228.9 lb

## 2020-03-16 DIAGNOSIS — I25118 Atherosclerotic heart disease of native coronary artery with other forms of angina pectoris: Secondary | ICD-10-CM

## 2020-03-16 DIAGNOSIS — E782 Mixed hyperlipidemia: Secondary | ICD-10-CM

## 2020-03-16 MED ORDER — ROSUVASTATIN CALCIUM 20 MG PO TABS: 20.0000 mg | ORAL_TABLET | Freq: Every day | ORAL | 3 refills | Status: AC

## 2020-03-16 NOTE — Progress Notes (Signed)
Primary Physician/Referring:  Tereasa Coop, PA-C  Patient ID: Drew Drew [name], Drew [name]    DOB: 09/13/1955, 64 y.o.   MRN: [id number]  Chief Complaint  Patient presents with  . Coronary Artery Disease  . Follow-up    6 month   HPI:    Drew Drew [name]  is a 64 y.o. Caucasian Drew [name] who presents for follow-up of angina pectoris, underwent coronary angiography on 04/06/2018, had a very large occluded RCA, thrombotic occlusion, underwent thrombectomy and successful DES stenting with a 3.5 mm stent.  He now presents for follow-up. PMH significant for  hyperlipidemia, prediabetes, obesity.  He presents for follow-up of coronary artery disease, hypertension and hyperlipidemia.  He has no specific complaints today.  Past Medical History:  Diagnosis Date  . Colon polyps 2010  . GERD (gastroesophageal reflux disease)   . HLD (hyperlipidemia)   . Hypertension    Past Surgical History:  Procedure Laterality Date  . CATARACT EXTRACTION    . CORONARY STENT INTERVENTION N/A 04/06/2018   Procedure: CORONARY STENT INTERVENTION;  Surgeon: Adrian Prows, MD;  Location: [facility];  Service: Cardiovascular;  Laterality: N/A;  . CORONARY THROMBECTOMY N/A 04/06/2018   Procedure: Coronary Thrombectomy;  Surgeon: Adrian Prows, MD;  Location: [facility];  Service: Cardiovascular;  Laterality: N/A;  . LEFT HEART CATH AND CORONARY ANGIOGRAPHY N/A 04/06/2018   Procedure: LEFT HEART CATH AND CORONARY ANGIOGRAPHY;  Surgeon: Adrian Prows, MD;  Location: [facility];  Service: Cardiovascular;  Laterality: N/A;   Family History  Problem Relation Age of Onset  . Diabetes Sister   . Heart disease Father   . Brain cancer Mother        chemical exposure    Social History   Tobacco Use  . Smoking status: Never Smoker  . Smokeless tobacco: Never Used  Substance Use Topics  . Alcohol use: Yes    Alcohol/week: 0.0 standard drinks    Comment: 1-2 per day   ROS  Review of Systems  Cardiovascular:  Positive for dyspnea on exertion. Negative for chest pain and leg swelling.  Musculoskeletal: Positive for back pain.  Gastrointestinal: Negative for melena.   Objective  Blood pressure 121/73, pulse 64, resp. rate 16, height 5\' 4"  (1.626 m), weight 228 lb 14.4 oz (103.8 kg), SpO2 96 %.  Vitals with BMI 03/16/2020 03/16/2020 09/09/2019  Height 5\' 4"  5\' 4"  5\' 4"   Weight 228 lbs 14 oz - 218 lbs  BMI 15.17 - 61.6  Systolic - 073 710  Diastolic - 73 68  Pulse - 64 58     Physical Exam Constitutional:      Appearance: He is well-developed.     Comments: Morbidly obese  Neck:     Comments: Short neck and difficult to evaluate J[facility] Cardiovascular:     Rate and Rhythm: Normal rate and regular rhythm.     Pulses: Intact distal pulses.          Dorsalis pedis pulses are 2+ on the right side.       Posterior tibial pulses are 2+ on the right side.     Heart sounds: Normal heart sounds. No murmur heard. No gallop.      Comments: Femoral and popliteal pulse difficult to feel due to patient's body habitus. Normal pedal pulse.  Bilateral onychomycosis noted in toe nails. Trace bilateral ankle edema.  No JVD.  Pulmonary:     Effort: Pulmonary effort is normal.     Breath sounds:  Normal breath sounds.  Abdominal:     General: Bowel sounds are normal.     Palpations: Abdomen is soft.     Comments: Obese. Pannus present    Laboratory examination:   No results for input(s): NA, K, CL, CO2, GLUCOSE, BUN, CREATININE, CALCIUM, GFRNONAA, GFRAA in the last 8760 hours. CrCl cannot be calculated (Patient's most recent lab result is older than the maximum 21 days allowed.).  CMP Latest Ref Rng & Units 08/14/2018 05/04/2018 03/06/2018  Glucose 65 - 99 mg/dL 106(H) 98 100(H)  BUN 8 - 27 mg/dL 20 24 16   Creatinine 0.76 - 1.27 mg/dL 1.23 1.13 1.00  Sodium 134 - 144 mmol/L 138 142 138  Potassium 3.5 - 5.2 mmol/L 4.3 4.8 4.2  Chloride 96 - 106 mmol/L 100 103 103  CO2 20 - 29 mmol/L 25 25 -  Calcium 8.6  - 10.2 mg/dL 9.4 9.5 -  Total Protein 6.0 - 8.5 g/dL 6.8 - -  Total Bilirubin 0.0 - 1.2 mg/dL 0.6 - -  Alkaline Phos 39 - 117 IU/L 78 - -  AST 0 - 40 IU/L 27 - -  ALT 0 - 44 IU/L 33 - -   CBC Latest Ref Rng & Units 08/14/2018 03/06/2018 05/27/2017  WBC 3.4 - 10.8 x10E3/uL 7.1 - 7.3  Hemoglobin 13.0 - 17.7 g/dL 14.0 15.0 14.6  Hematocrit 37.5 - 51.0 % 39.9 44.0 43.3  Platelets 150 - 450 x10E3/uL 166 - 185    Lipid Panel Recent Labs    08/29/19 1121 08/29/19 1122 03/12/20 1031  CHOL  --  163 161  TRIG  --  169* 247*  LDLCALC  --  94 81  HDL  --  40 39*  LDLDIRECT 94  --   --      External labs:   Labs 03/05/2020:  Hb 14.1/HCT 42.2, platelets 156.  Normal indicis.  A1c 6.2%.  Total cholesterol 170, triglycerides 237, HDL 44, LDL 79.  Medications and allergies   Allergies  Allergen Reactions  . Scallops [Shellfish Allergy]     Vomiting      Current Outpatient Medications  Medication Instructions  . aspirin EC 81 mg, Oral, Daily  . ezetimibe (ZETIA) 10 mg, Oral, Daily after supper  . famotidine (PEPCID) 20 MG tablet TAKE 1 TO 2 TABLETS BY MOUTH ONCE DAILY AS NEEDED FOR HEARTBURN OR INDIGESTION  . HYDROcodone-acetaminophen (NORCO/VICODIN) 5-325 MG tablet 1 tablet, Oral, Every 6 hours PRN  . metoprolol succinate (TOPROL-XL) 25 mg, Oral, Daily  . nitroGLYCERIN (NITROSTAT) 0.4 mg, Sublingual, Every 5 min x3 PRN  . olmesartan-hydrochlorothiazide (BENICAR HCT) 20-12.5 MG tablet TAKE 1 TABLET BY MOUTH ONCE DAILY IN THE MORNING  . rosuvastatin (CRESTOR) 20 mg, Oral, Daily   Radiology:   No results found.  Cardiac Studies:   Coronary angiogram 04/06/2018: Very large RCA occluded in the proximal segment S/P thrombectomy followed by Orsiro 3.5x22 DES. Normal LVEF, mild disease in other vessels.  Echocardiogram 03/12/2018: 1. Left ventricle cavity is normal in size. Mild concentric hypertrophy of the left ventricle. Normal global wall motion. Normal diastolic filling  pattern, normal LAP. Calculated EF 55%. 2. Trace mitral regurgitation. Trace tricuspid regurgitation. Trace pulmonic regurgitation.  EKG   EKG 03/16/2020: Normal sinus rhythm with rate of 61 bpm, normal axis, no evidence of ischemia, normal EKG.  No significant change from 06/06/2019.    Assessment     ICD-10-CM   1. Coronary artery disease of native artery of native heart with stable angina  pectoris ([place])  I25.118 EKG 12-Lead    rosuvastatin (CRESTOR) 20 MG tablet  2. Mixed hyperlipidemia  E78.2 rosuvastatin (CRESTOR) 20 MG tablet    LDL cholesterol, direct    Lipid Panel With LDL/HDL Ratio     Meds ordered this encounter  Medications  . rosuvastatin (CRESTOR) 20 MG tablet    Sig: Take 1 tablet (20 mg total) by mouth daily.    Dispense:  90 tablet    Refill:  3    Medications Discontinued During This Encounter  Medication Reason  . rosuvastatin (CRESTOR) 20 MG tablet Reorder    Recommendations:   Drew Drew [name]  is a 64 y.o. Caucasian Drew [name] who presents for follow-up of angina pectoris, underwent coronary angiography on 04/06/2018, had a very large occluded RCA, thrombotic occlusion, underwent thrombectomy and successful DES stenting with a 3.5 mm stent.  He now presents for follow-up. PMH significant for  hyperlipidemia, prediabetes, obesity.   This is a 16-month office visit, he has not had any further angina pectoris, no clinical evidence of heart failure.  I reviewed his external labs, his lipids are not at goal.  He has continued elevation in triglycerides and also LDL is still >70.  We will increase his Crestor from 10 mg to 20 mg daily.  He will repeat his lipids in 6 weeks.  I have extensively discussed with him regarding reducing his calorie intake.  He is only 64 years of age with significant CAD.  Hopefully he will make lifestyle changes.  Otherwise he is stable from cardiac standpoint, blood pressure is also well controlled, is on appropriate medical therapy, I will see  him back in a year.   Adrian Prows, MD, [facility] 03/16/2020, 4:40 PM Office: [phone] Pager: [phone]

## 2020-06-23 ENCOUNTER — Other Ambulatory Visit: Payer: Self-pay | Admitting: Cardiology

## 2020-10-07 ENCOUNTER — Other Ambulatory Visit: Payer: Self-pay | Admitting: Cardiology

## 2020-10-07 DIAGNOSIS — I25118 Atherosclerotic heart disease of native coronary artery with other forms of angina pectoris: Secondary | ICD-10-CM

## 2020-10-07 DIAGNOSIS — E782 Mixed hyperlipidemia: Secondary | ICD-10-CM

## 2020-11-16 ENCOUNTER — Encounter (HOSPITAL_COMMUNITY): Payer: Self-pay | Admitting: Emergency Medicine

## 2020-11-16 ENCOUNTER — Encounter (HOSPITAL_COMMUNITY): Payer: Self-pay

## 2020-11-16 ENCOUNTER — Emergency Department (HOSPITAL_COMMUNITY): Payer: BC Managed Care – PPO

## 2020-11-16 ENCOUNTER — Emergency Department (HOSPITAL_COMMUNITY)
Admission: EM | Admit: 2020-11-16 | Discharge: 2020-11-16 | Disposition: A | Discharge status: Home / Self Care | Payer: BC Managed Care – PPO | Attending: Emergency Medicine | Admitting: Emergency Medicine

## 2020-11-16 ENCOUNTER — Other Ambulatory Visit: Payer: Self-pay

## 2020-11-16 DIAGNOSIS — S2243XA Multiple fractures of ribs, bilateral, initial encounter for closed fracture: Secondary | ICD-10-CM

## 2020-11-16 DIAGNOSIS — T1490XA Injury, unspecified, initial encounter: Secondary | ICD-10-CM

## 2020-11-16 DIAGNOSIS — Y9301 Activity, walking, marching and hiking: Secondary | ICD-10-CM | POA: Diagnosis not present

## 2020-11-16 DIAGNOSIS — W228XXA Striking against or struck by other objects, initial encounter: Secondary | ICD-10-CM | POA: Insufficient documentation

## 2020-11-16 DIAGNOSIS — S50311A Abrasion of right elbow, initial encounter: Secondary | ICD-10-CM | POA: Insufficient documentation

## 2020-11-16 DIAGNOSIS — S3993XA Unspecified injury of pelvis, initial encounter: Secondary | ICD-10-CM | POA: Diagnosis not present

## 2020-11-16 DIAGNOSIS — Z7982 Long term (current) use of aspirin: Secondary | ICD-10-CM | POA: Insufficient documentation

## 2020-11-16 DIAGNOSIS — Y92410 Unspecified street and highway as the place of occurrence of the external cause: Secondary | ICD-10-CM | POA: Diagnosis not present

## 2020-11-16 DIAGNOSIS — M79652 Pain in left thigh: Secondary | ICD-10-CM | POA: Insufficient documentation

## 2020-11-16 DIAGNOSIS — S299XXA Unspecified injury of thorax, initial encounter: Secondary | ICD-10-CM | POA: Diagnosis present

## 2020-11-16 LAB — CBC WITH DIFFERENTIAL/PLATELET
Abs Immature Granulocytes: 0.03 10*3/uL (ref 0.00–0.07)
Basophils Absolute: 0 10*3/uL (ref 0.0–0.1)
Basophils Relative: 0 %
Eosinophils Absolute: 0 10*3/uL (ref 0.0–0.5)
Eosinophils Relative: 0 %
HCT: 41.9 % (ref 39.0–52.0)
Hemoglobin: 13.7 g/dL (ref 13.0–17.0)
Immature Granulocytes: 0 %
Lymphocytes Relative: 27 %
Lymphs Abs: 2.2 10*3/uL (ref 0.7–4.0)
MCH: 31.1 pg (ref 26.0–34.0)
MCHC: 32.7 g/dL (ref 30.0–36.0)
MCV: 95 fL (ref 80.0–100.0)
Monocytes Absolute: 0.6 10*3/uL (ref 0.1–1.0)
Monocytes Relative: 7 %
Neutro Abs: 5.2 10*3/uL (ref 1.7–7.7)
Neutrophils Relative %: 66 %
Platelets: 142 10*3/uL — ABNORMAL LOW (ref 150–400)
RBC: 4.41 MIL/uL (ref 4.22–5.81)
RDW: 12.9 % (ref 11.5–15.5)
WBC: 8.1 10*3/uL (ref 4.0–10.5)
nRBC: 0 % (ref 0.0–0.2)

## 2020-11-16 LAB — I-STAT CHEM 8, ED
BUN: 23 mg/dL (ref 8–23)
Calcium, Ion: 1.1 mmol/L — ABNORMAL LOW (ref 1.15–1.40)
Chloride: 106 mmol/L (ref 98–111)
Creatinine, Ser: 1 mg/dL (ref 0.61–1.24)
Glucose, Bld: 137 mg/dL — ABNORMAL HIGH (ref 70–99)
HCT: 39 % (ref 39.0–52.0)
Hemoglobin: 13.3 g/dL (ref 13.0–17.0)
Potassium: 4.7 mmol/L (ref 3.5–5.1)
Sodium: 140 mmol/L (ref 135–145)
TCO2: 27 mmol/L (ref 22–32)

## 2020-11-16 LAB — COMPREHENSIVE METABOLIC PANEL
ALT: 30 U/L (ref 0–44)
AST: 29 U/L (ref 15–41)
Albumin: 3.6 g/dL (ref 3.5–5.0)
Alkaline Phosphatase: 55 U/L (ref 38–126)
Anion gap: 11 (ref 5–15)
BUN: 21 mg/dL (ref 8–23)
CO2: 22 mmol/L (ref 22–32)
Calcium: 9.1 mg/dL (ref 8.9–10.3)
Chloride: 108 mmol/L (ref 98–111)
Creatinine, Ser: 1.14 mg/dL (ref 0.61–1.24)
GFR, Estimated: >60 mL/min (ref >60)
Glucose, Bld: 142 mg/dL — ABNORMAL HIGH (ref 70–99)
Potassium: 4.6 mmol/L (ref 3.5–5.1)
Sodium: 141 mmol/L (ref 135–145)
Total Bilirubin: 0.9 mg/dL (ref 0.3–1.2)
Total Protein: 6.5 g/dL (ref 6.5–8.1)

## 2020-11-16 MED ORDER — KETOROLAC TROMETHAMINE 15 MG/ML IJ SOLN: 15.0000 mg | Freq: Once | INTRAMUSCULAR | Status: DC

## 2020-11-16 MED ORDER — ONDANSETRON HCL 4 MG/2ML IJ SOLN
4.0000 mg | Freq: Once | INTRAMUSCULAR | Status: AC
  Administered 2020-11-16: 4 mg via INTRAVENOUS
  Filled 2020-11-16: qty 2

## 2020-11-16 MED ORDER — DICLOFENAC SODIUM 1 % EX GEL: 4.0000 g | Freq: Four times a day (QID) | CUTANEOUS | 0 refills | Status: AC

## 2020-11-16 MED ORDER — LIDOCAINE-EPINEPHRINE (PF) 2 %-1:200000 IJ SOLN
10.0000 mL | Freq: Once | INTRAMUSCULAR | Status: AC
  Administered 2020-11-16: 10 mL via INTRADERMAL
  Filled 2020-11-16: qty 20

## 2020-11-16 MED ORDER — ONDANSETRON 4 MG PO TBDP: ORAL_TABLET | ORAL | 0 refills | Status: DC

## 2020-11-16 MED ORDER — MORPHINE SULFATE (PF) 4 MG/ML IV SOLN
4.0000 mg | Freq: Once | INTRAVENOUS | Status: AC
Start: 2020-11-16 — End: 2020-11-16
  Administered 2020-11-16: 4 mg via INTRAVENOUS
  Filled 2020-11-16: qty 1

## 2020-11-16 MED ORDER — MORPHINE SULFATE 15 MG PO TABS: 7.5000 mg | ORAL_TABLET | ORAL | 0 refills | Status: DC | PRN

## 2020-11-16 MED ORDER — TIZANIDINE HCL 4 MG PO TABS
2.0000 mg | ORAL_TABLET | Freq: Once | ORAL | Status: AC
  Administered 2020-11-16: 2 mg via ORAL
  Filled 2020-11-16: qty 1

## 2020-11-16 MED ORDER — IOHEXOL 350 MG/ML SOLN
97.0000 mL | Freq: Once | INTRAVENOUS | Status: AC | PRN
  Administered 2020-11-16: 97 mL via INTRAVENOUS
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
## 2020-11-16 NOTE — ED Notes (Signed)
Pt's blood pressure was 83/53. Dr. Vanita Panda made aware and states that he feels it is ok to discharge pt. Pt denies any lightheadedness and dizziness.
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
## 2020-11-16 NOTE — ED Notes (Signed)
Ambulated pt, pt could only walk around the room very slowly. Pt stated they were in a lot of pain and hurting in his back and legs.

## 2020-11-16 NOTE — Progress Notes (Signed)
This chaplain phoned ED-Debbie for spiritual care F/U. This chaplain understands no needs at this time.
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
## 2020-11-16 NOTE — ED Notes (Signed)
Pt discharged and wheeled out of the ED in a wheel chair without difficulty. 

## 2020-11-16 NOTE — ED Provider Notes (Signed)
9:38 PM Patient has been seen and evaluated, accompanied by his wife several times since signout. Patient's pain was reasonably controlled but he continued to have muscle spasms, and some nausea requiring additional dosing of appropriate medications.  Now, the patient has been ambulatory, though with some discomfort, but with preference to go home-we discussed admission for further pain control and monitoring versus discharge with close outpatient follow-up as needed and ongoing pain medication. Patient's preference for discharge, coupled with absence of deterioration after several hours of monitoring, patient is appropriate for discharge.   Carmin Muskrat, MD 11/16/20 2139

## 2020-11-16 NOTE — ED Notes (Signed)
Called xray and pt is still in xray department for imaging

## 2020-11-16 NOTE — Discharge Instructions (Signed)
Use the gel as prescribed. Also take tylenol '1000mg'$ (2 extra strength) four times a day.  Use your incentive spirometer 10 minutes out of every hour you are awake.  Please return for worsening pain difficulty breathing or fever. Then take the pain medicine if you feel like you need it. Narcotics do not help with the pain, they only make you care about it less.  You can become addicted to this, people may break into your house to steal it.  It will constipate you.  If you drive under the influence of this medicine you can get a DUI.
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
## 2020-11-16 NOTE — ED Notes (Signed)
Pt transported to CT by TRN 

## 2020-11-17 ENCOUNTER — Encounter (HOSPITAL_COMMUNITY): Payer: Self-pay

## 2020-11-17 ENCOUNTER — Other Ambulatory Visit (HOSPITAL_COMMUNITY): Payer: Self-pay | Admitting: Emergency Medicine

## 2020-11-17 ENCOUNTER — Telehealth (HOSPITAL_COMMUNITY): Payer: Self-pay | Admitting: Emergency Medicine

## 2020-11-17 MED ORDER — TIZANIDINE HCL 2 MG PO CAPS: 2.0000 mg | ORAL_CAPSULE | Freq: Three times a day (TID) | ORAL | 0 refills | Status: DC

## 2020-12-22 ENCOUNTER — Ambulatory Visit
Admission: RE | Admit: 2020-12-22 | Discharge: 2020-12-22 | Disposition: A | Discharge status: Home / Self Care | Payer: BC Managed Care – PPO | Source: Ambulatory Visit | Attending: Cardiology | Admitting: Cardiology

## 2020-12-22 ENCOUNTER — Other Ambulatory Visit: Payer: Self-pay | Admitting: Neurology

## 2020-12-22 ENCOUNTER — Other Ambulatory Visit: Payer: Self-pay | Admitting: Cardiology

## 2020-12-22 DIAGNOSIS — M79662 Pain in left lower leg: Secondary | ICD-10-CM

## 2021-02-05 ENCOUNTER — Other Ambulatory Visit: Payer: Self-pay | Admitting: Cardiology

## 2021-06-27 ENCOUNTER — Other Ambulatory Visit: Payer: Self-pay | Admitting: Cardiology

## 2022-08-29 ENCOUNTER — Encounter: Payer: Self-pay | Admitting: Gastroenterology
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
## 2023-01-19 ENCOUNTER — Telehealth: Payer: Self-pay | Admitting: Cardiology

## 2023-01-19 NOTE — Telephone Encounter (Signed)
I spoke with patient and scheduled him to see Dr Rosemary Holms on October 29 , 2024 at 8:30.  Patient confirms he has stopped metoprolol

## 2023-01-19 NOTE — Telephone Encounter (Signed)
Yates Decamp, MD     Patient of mine and not seen for almost 2 years, atypical chest pain, normal EKG with marked bradycardia, advised to stop Metoprolol. Can he be seen by one of [place] or PA/NP in the next week or so

## 2023-01-23 NOTE — Progress Notes (Unsigned)
  Cardiology Office Note:  .   Date:  01/23/2023  ID:  Drew [name], DOB 15-Apr-1955, MRN [id number] PCP: Silvestre Mesi  [facility] Providers Cardiologist:  Truett Mainland, MD PCP: Ofilia Neas, PA-C  No chief complaint on file.     History of Present Illness: .    Drew [name] is a 67 y.o. male with hypertension, hyperlipidemia, CAD  ***  There were no vitals filed for this visit.   ROS: *** ROS   Studies Reviewed: .        *** Risk Assessment/Calculations:   {Does this patient have ATRIAL FIBRILLATION?:[id number]}   Physical Exam:   Physical Exam   VISIT DIAGNOSES: No diagnosis found.   ASSESSMENT AND PLAN: .    Drew [name] is a 67 y.o. male with hypertension, hyperlipidemia, CAD  CAD: PCI to proximal/mid RCA in 2020. ***  {Are you ordering a CV Procedure (e.g. stress test, cath, DCCV, TEE, etc)?   Press F2        :[id number]}    No orders of the defined types were placed in this encounter.    F/u in ***  Signed, Elder Negus, MD

## 2023-01-24 ENCOUNTER — Encounter: Payer: Self-pay | Admitting: Cardiology

## 2023-01-24 ENCOUNTER — Ambulatory Visit: Payer: BC Managed Care – PPO | Attending: Cardiology | Admitting: Cardiology

## 2023-01-24 ENCOUNTER — Encounter: Payer: Self-pay | Admitting: *Deleted

## 2023-01-24 VITALS — BP 114/62 | HR 62 | Resp 16 | Ht 64.0 in | Wt 218.6 lb

## 2023-01-24 DIAGNOSIS — I25118 Atherosclerotic heart disease of native coronary artery with other forms of angina pectoris: Secondary | ICD-10-CM | POA: Diagnosis not present

## 2023-01-24 DIAGNOSIS — I10 Essential (primary) hypertension: Secondary | ICD-10-CM | POA: Diagnosis not present

## 2023-01-24 DIAGNOSIS — R072 Precordial pain: Secondary | ICD-10-CM

## 2023-01-24 DIAGNOSIS — E782 Mixed hyperlipidemia: Secondary | ICD-10-CM | POA: Diagnosis not present

## 2023-01-24 NOTE — Patient Instructions (Signed)
Medication Instructions:   Your physician recommends that you continue on your current medications as directed. Please refer to the Current Medication list given to you today.  *If you need a refill on your cardiac medications before your next appointment, please call your pharmacy*    Testing/Procedures:  Your physician has requested that you have an echocardiogram. Echocardiography is a painless test that uses sound waves to create images of your heart. It provides your doctor with information about the size and shape of your heart and how well your heart's chambers and valves are working. This procedure takes approximately one hour. There are no restrictions for this procedure. Please do NOT wear cologne, perfume, aftershave, or lotions (deodorant is allowed). Please arrive 15 minutes prior to your appointment time.   Your physician has requested that you have en exercise stress myoview. For further information please visit [email or web address] Please follow instruction sheet, as given.    Follow-Up:  3 MONTHS WITH DR. GANJI OR AN EXTENDER

## 2023-01-27 ENCOUNTER — Telehealth (HOSPITAL_COMMUNITY): Payer: Self-pay | Admitting: *Deleted

## 2023-01-27 NOTE — Telephone Encounter (Signed)
Spoke with patient and gave instructions about his STRESS TEST on 02/03/23 at 7:45.

## 2023-02-03 ENCOUNTER — Ambulatory Visit (HOSPITAL_COMMUNITY): Payer: BC Managed Care – PPO | Attending: Cardiology

## 2023-02-03 DIAGNOSIS — R072 Precordial pain: Secondary | ICD-10-CM | POA: Diagnosis present

## 2023-02-03 LAB — MYOCARDIAL PERFUSION IMAGING
Base ST Depression (mm): 0 mm
Estimated workload: 7
Exercise duration (min): 6 min
Exercise duration (sec): 1 s
LV dias vol: 53 mL (ref 62–150)
LV sys vol: 19 mL
MPHR: 151 {beats}/min
Nuc Stress EF: 65 %
Peak HR: 141 {beats}/min
Percent HR: 93 %
Rest HR: 56 {beats}/min
Rest Nuclear Isotope Dose: 8.7 mCi
SDS: 0
SRS: 0
SSS: 0
ST Depression (mm): 0 mm
Stress Nuclear Isotope Dose: 25.1 mCi
TID: 0.87

## 2023-02-03 MED ORDER — TECHNETIUM TC 99M TETROFOSMIN IV KIT
25.1000 | PACK | Freq: Once | INTRAVENOUS | Status: AC | PRN
  Administered 2023-02-03: 25.1 via INTRAVENOUS

## 2023-02-03 MED ORDER — TECHNETIUM TC 99M TETROFOSMIN IV KIT
8.7000 | PACK | Freq: Once | INTRAVENOUS | Status: AC | PRN
  Administered 2023-02-03: 8.7 via INTRAVENOUS

## 2023-03-03 ENCOUNTER — Ambulatory Visit (HOSPITAL_COMMUNITY): Payer: BC Managed Care – PPO | Attending: Cardiology

## 2023-03-03 DIAGNOSIS — R072 Precordial pain: Secondary | ICD-10-CM | POA: Insufficient documentation

## 2023-03-03 LAB — ECHOCARDIOGRAM COMPLETE
Area-P 1/2: 2.69 cm2
S' Lateral: 2.8 cm

## 2023-05-05 ENCOUNTER — Encounter: Payer: Self-pay | Admitting: Cardiology

## 2023-05-05 ENCOUNTER — Ambulatory Visit: Payer: 59 | Attending: Cardiology | Admitting: Cardiology

## 2023-05-05 VITALS — BP 127/76 | HR 61 | Resp 16 | Ht 64.0 in | Wt 223.0 lb

## 2023-05-05 DIAGNOSIS — I25118 Atherosclerotic heart disease of native coronary artery with other forms of angina pectoris: Secondary | ICD-10-CM

## 2023-05-05 DIAGNOSIS — I10 Essential (primary) hypertension: Secondary | ICD-10-CM

## 2023-05-05 DIAGNOSIS — E782 Mixed hyperlipidemia: Secondary | ICD-10-CM

## 2023-05-05 DIAGNOSIS — E119 Type 2 diabetes mellitus without complications: Secondary | ICD-10-CM | POA: Diagnosis not present

## 2023-05-05 NOTE — Patient Instructions (Signed)
 Medication Instructions:  The current medical regimen is effective;  continue present plan and medications.  *If you need a refill on your cardiac medications before your next appointment, please call your pharmacy*  Follow-Up: At [facility], you and your health needs are our priority.  As part of our continuing mission to provide you with exceptional heart care, we have created designated Provider Care Teams.  These Care Teams include your primary Cardiologist (physician) and Advanced Practice Providers (APPs -  Physician Assistants and Nurse Practitioners) who all work together to provide you with the care you need, when you need it.  We recommend signing up for the patient portal called "MyChart".  Sign up information is provided on this After Visit Summary.  MyChart is used to connect with patients for Virtual Visits (Telemedicine).  Patients are able to view lab/test results, encounter notes, upcoming appointments, etc.  Non-urgent messages can be sent to your provider as well.   To learn more about what you can do with MyChart, go to [email or web address].    Your next appointment:   1 year(s)  Provider:   Yates Decamp, MD

## 2023-05-05 NOTE — Progress Notes (Signed)
 Cardiology Office Note:  .   Date:  05/05/2023  ID:  Drew [name], DOB 05-14-1955, MRN [id number] PCP: Drew [name] Drew [name]  [facility] Providers Cardiologist:  Drew Bergamo, MD   History of Present Illness: .   Drew [name] is a 68 y.o. Caucasian male who presents for follow-up of angina pectoris, coronary angiogram on 04/06/2018, had a very large occluded RCA, thrombotic occlusion, underwent thrombectomy and successful DES stenting with a 3.5 mm stent.  He now presents for follow-up. PMH significant for  hyperlipidemia, prediabetes, obesity.   Discussed the use of AI scribe software for clinical note transcription with the patient, who gave verbal consent to proceed.  History of Present Illness   He experienced chest pain previously and was evaluated with a stress test and an echocardiogram after evaluation on 01/24/2023. The stress test was normal, and the echocardiogram showed complete recovery of heart function with normal wall motion. He had a stent placed in a previously occluded right coronary artery, which has healed completely. No further chest pain since the last visit.  No changes in medications since the last visit, although he recalls a medication was dropped but is unsure which one. He has been trying to exercise more, using a bicycle freestand indoors due to cold weather, and has been eating healthier, primarily non-meat-based foods. Despite these efforts, he has not lost weight and is concerned about portion sizes. He has reduced candy intake and occasionally consumes meat for protein.     Labs   Lab Results  Component Value Date   CHOL 161 03/12/2020   HDL 39 (L) 03/12/2020   LDLCALC 81 03/12/2020   LDLDIRECT 94 08/29/2019   TRIG 247 (H) 03/12/2020   CHOLHDL 4.3 05/27/2017   Lab Results  Component Value Date   NA 140 11/16/2020   K 4.7 11/16/2020   CO2 22 11/16/2020   GLUCOSE 137 (H) 11/16/2020   BUN 23 11/16/2020   CREATININE 1.00 11/16/2020   CALCIUM  9.1  11/16/2020   GFRNONAA >60 11/16/2020      Latest Ref Rng & Units 11/16/2020   12:39 PM 11/16/2020   12:24 PM 08/14/2018   10:56 AM  BMP  Glucose 70 - 99 mg/dL 862  857  893   BUN 8 - 23 mg/dL 23  21  20    Creatinine 0.61 - 1.24 mg/dL 8.99  8.85  8.76   BUN/Creat Ratio 10 - 24   16   Sodium 135 - 145 mmol/L 140  141  138   Potassium 3.5 - 5.1 mmol/L 4.7  4.6  4.3   Chloride 98 - 111 mmol/L 106  108  100   CO2 22 - 32 mmol/L  22  25   Calcium  8.9 - 10.3 mg/dL  9.1  9.4       Latest Ref Rng & Units 11/16/2020   12:39 PM 11/16/2020   12:24 PM 08/14/2018   10:56 AM  CBC  WBC 4.0 - 10.5 K/uL  8.1  7.1   Hemoglobin 13.0 - 17.0 g/dL 86.6  86.2  85.9   Hematocrit 39.0 - 52.0 % 39.0  41.9  39.9   Platelets 150 - 400 K/uL  142  166    External Labs:  Labs 04/13/2022:  Total cholesterol 156, triglycerides 204, HDL 44, LDL 71.  A1c 6.5%.  Labs 10/26/2022:  Hb 14.3/HCT 45.1, platelets 100 67K.  Sodium 138, potassium 4.3, BUN 25, creatinine 1.2, EGFR 65 mL.  Review of Systems  Cardiovascular:  Negative for chest pain, dyspnea on exertion and leg swelling.    Physical Exam:   VS:  BP 127/76 (BP Location: Left Arm, Patient Position: Sitting, Cuff Size: Large)   Pulse 61   Resp 16   Ht 5' 4 (1.626 m)   Wt 223 lb (101.2 kg)   SpO2 97%   BMI 38.28 kg/m    Wt Readings from Last 3 Encounters:  05/05/23 223 lb (101.2 kg)  02/03/23 218 lb (98.9 kg)  01/24/23 218 lb 9.6 oz (99.2 kg)   Physical Exam Constitutional:      Appearance: He is obese.  Neck:     Vascular: No carotid bruit or JVD.  Cardiovascular:     Rate and Rhythm: Normal rate and regular rhythm.     Pulses: Intact distal pulses.     Heart sounds: Normal heart sounds. No murmur heard.    No gallop.  Pulmonary:     Effort: Pulmonary effort is normal.     Breath sounds: Normal breath sounds.  Abdominal:     General: Bowel sounds are normal.     Palpations: Abdomen is soft.  Musculoskeletal:     Right lower leg:  No edema.     Left lower leg: No edema.     Studies Reviewed: SABRA    Coronary angiogram 04/06/2018:  Very large RCA which is occluded in the proximal segment.  S/P thrombectomy followed by ORSIRO 3.5X22  DES placed.  100% stenosis reduced to 0%  .   MYOCARDIAL PERFUSION IMAGING 02/03/2023   Narrative   The study is normal. The study is low risk.   LV perfusion is normal. There is no evidence of ischemia. There is no evidence of infarction.   Left ventricular function is normal. Nuclear stress EF: 65%. The left ventricular ejection fraction is normal (55-65%). End diastolic cavity size is normal. End systolic cavity size is normal.   ECHOCARDIOGRAM COMPLETE 03/03/2023  1. Left ventricular ejection fraction, by estimation, is 60 to 65%. The left ventricle has normal function. The left ventricle has no regional wall motion abnormalities. There is mild left ventricular hypertrophy. Left ventricular diastolic parameters were normal. 2. Right ventricular systolic function is normal. The right ventricular size is normal. Tricuspid regurgitation signal is inadequate for assessing PA pressure. 3. The mitral valve is normal in structure. Trivial mitral valve regurgitation. 4. The aortic valve is tricuspid. Aortic valve regurgitation is trivial.  EKG:     Medications and allergies    Allergies  Allergen Reactions   Other Nausea And Vomiting    Scallops   Scallops [Shellfish Allergy]     Vomiting     Current Outpatient Medications:    aspirin  EC 81 MG tablet, Take 81 mg by mouth daily. Swallow whole., Disp: , Rfl:    ezetimibe  (ZETIA ) 10 MG tablet, TAKE 1 TABLET BY MOUTH ONCE DAILY AFTER  SUPPER, Disp: 90 tablet, Rfl: 0   famotidine  (PEPCID ) 20 MG tablet, TAKE 1 TO 2 TABLETS BY MOUTH ONCE DAILY AS NEEDED FOR HEARTBURN OR INDIGESTION, Disp: 60 tablet, Rfl: 3   metFORMIN (GLUCOPHAGE-XR) 500 MG 24 hr tablet, Take 500 mg by mouth at bedtime., Disp: , Rfl:    olmesartan-hydrochlorothiazide  (BENICAR HCT) 20-12.5 MG tablet, Take 1 tablet by mouth daily., Disp: , Rfl:    rosuvastatin  (CRESTOR ) 20 MG tablet, Take 1 tablet (20 mg total) by mouth daily., Disp: 90 tablet, Rfl: 3   terbinafine (LAMISIL) 250 MG tablet, Take 1 tablet  by mouth daily., Disp: , Rfl:    diclofenac  Sodium (VOLTAREN ) 1 % GEL, Apply 4 g topically 4 (four) times daily. (Patient not taking: Reported on 05/05/2023), Disp: 100 g, Rfl: 0   HYDROcodone -acetaminophen  (NORCO/VICODIN) 5-325 MG tablet, Take 1 tablet by mouth every 6 (six) hours as needed for moderate pain. (Patient not taking: Reported on 05/05/2023), Disp: , Rfl:    nitroGLYCERIN  (NITROSTAT ) 0.4 MG SL tablet, Place 1 tablet (0.4 mg total) under the tongue every 5 (five) minutes x 3 doses as needed for chest pain. (Patient not taking: Reported on 05/05/2023), Disp: 25 tablet, Rfl: 2   ASSESSMENT AND PLAN: .      ICD-10-CM   1. Coronary artery disease of native artery of native heart with stable angina pectoris (HCC)  I25.118     2. Primary hypertension  I10     3. Mixed hyperlipidemia  E78.2     4. Type 2 diabetes mellitus without complication, without long-term current use of insulin (HCC)  E11.9       Assessment and Plan    Coronary Artery Disease (CAD) Previously occluded right coronary artery treated with a stent. Recent stress test and echocardiogram show complete recovery and normal heart function. No further chest pain reported. - Continue current medications - Follow-up in one year unless new symptoms arise -I have discontinued metoprolol  in view of marked bradycardia and also soft blood pressure.  He is without angina pectoris and has not used any sublingual nitroglycerin .  Mixed hypercholesterolemia with hypertriglyceridemia Triglycerides elevated at 204 mg/dL, likely due to dietary habits. LDL well-controlled at 71 mg/dL. - Continue current cholesterol management - Advise dietary modifications to reduce carbohydrate and sugar intake -Blood  pressure is also well-controlled on olmesartan HCT 20/12.5 mg daily.  External labs reviewed, renal function has remained stable.  Diabetes Mellitus Type 2 New onset diabetes mellitus with an A1c of 6.5%. Discussed benefits of starting Ozempic or Wegovy for weight loss and glycemic control, including cardiovascular and renal benefits.  Potential side effects include nausea, vomiting, abdominal distention, and constipation. Emphasized slow eating to mitigate side effects. Patients have lost 50-60 pounds with this treatment. Goal is to improve quality of life and longevity by losing 30 pounds. - Send a message to primary care physician to start Ozempic or [facility] - Start with a low dose and escalate to the maximum dose -Slow eating, taking a break three fourths by through his meal to reduce side effects and also to improve satiety and weight loss discussed - Monitor A1c and weight loss progress - Discuss with primary care physician during the scheduled visit on May 09, 2023  Follow-up - Follow-up with primary care physician on May 09, 2023 - Return to cardiology in one year unless new symptoms arise.   Signed,  Drew Bergamo, MD, [facility] 05/05/2023, 12:30 PM [facility] [address] [place], [place] [address] Phone: [phone]. Fax:  [phone]

## 2023-05-22 ENCOUNTER — Ambulatory Visit: Payer: 59 | Attending: Physician Assistant | Admitting: Audiologist

## 2023-05-22 DIAGNOSIS — H90A32 Mixed conductive and sensorineural hearing loss, unilateral, left ear with restricted hearing on the contralateral side: Secondary | ICD-10-CM | POA: Insufficient documentation

## 2023-05-22 DIAGNOSIS — H90A21 Sensorineural hearing loss, unilateral, right ear, with restricted hearing on the contralateral side: Secondary | ICD-10-CM | POA: Insufficient documentation

## 2023-05-22 NOTE — Procedures (Signed)
  Outpatient Audiology and [facility] [address] [place], [place]  [address] [phone]  AUDIOLOGICAL  EVALUATION  NAME: Drew [name]     DOB:   [birth year]      MRN: [id number]                                                                                     DATE: 05/22/2023     REFERENT: Ofilia Neas, PA-C STATUS: Outpatient DIAGNOSIS: Mixed Loss Left Ear, Sensorineural Hearing Loss Right Ear   History: Drew [name] was seen for an audiological evaluation due to difficulty hearing from the left ear. Twenty years ago in [place] had tube surgery to the left ear. He says it was due to an infection that would not clear. He was told the ear did not heal correctly after the tube leading to a permanent hearing loss. He hears well from the right ear. He has not seen Otolaryngology since moving to [place].  Drew [name] denies pain or pressure today. He has intermittent pressure in both ears, especially since he just recovered from the flu.  Drew [name] has significant history of hazardous noise exposure from working around [employer].   Medical history shows diabetes which can be a risk for hearing loss.    Evaluation:  Otoscopy showed a clear view of the tympanic membranes, bilaterally Tympanometry results were consistent with negative pressure bilaterally, with hypercompliance in the left ear Audiometric testing was completed using Conventional Audiometry techniques with insert earphones and supraural headphones. Test results are consistent with normal sloping to moderate sensorineural  hearing loss in the right ear, and a mixed moderately severe hearing loss in the left. See audiogram below. Speech Recognition Thresholds were obtained at 30dB HL in the right ear and at 55dB HL in the left ear. Word Recognition Testing was completed at  70dB in the right and 85dB masking in the left, and Drew [name] scored 100% in each ear.    Results:  The test results were reviewed with Drew [name]. Drew [name]  has a mixed hearing loss in the left ear likely due to his surgery. He needs to establish care with Otolaryngology and receive an assessment for a hearing aid vs bone anchored device.  Audiogram printed and provided to [employer] List of local providers given. Recommend Drew [name] see [facility] as they have providers who do bone devices.   Recommendations: Hearing aid or BAHA recommended for left ear. Patient given list of local hearing aid providers.  Annual audiometric testing recommended to monitor hearing loss for progression.  Recommend referral to Otolaryngology due to mixed moderately severe hearing loss in left ear   36 minutes spent testing and counseling on results.   If you have any questions please feel free to contact me at [phone].  Ammie Ferrier Au.D.  Audiologist   05/22/2023  11:31 AM  Cc: Ofilia Neas, PA-C
